# Patient Record
Sex: Female | Born: 1955
Health system: Southern US, Community
[De-identification: ages and names within clinical notes are randomized; demographics above are authoritative.]

## PROBLEM LIST (undated history)

## (undated) DIAGNOSIS — I10 Essential (primary) hypertension: Secondary | ICD-10-CM

## (undated) DIAGNOSIS — Z9289 Personal history of other medical treatment: Secondary | ICD-10-CM

## (undated) DIAGNOSIS — E7800 Pure hypercholesterolemia, unspecified: Secondary | ICD-10-CM

## (undated) DIAGNOSIS — K269 Duodenal ulcer, unspecified as acute or chronic, without hemorrhage or perforation: Secondary | ICD-10-CM

## (undated) HISTORY — DX: Personal history of other medical treatment: Z92.89

## (undated) HISTORY — DX: Essential (primary) hypertension: I10

## (undated) HISTORY — DX: Pure hypercholesterolemia, unspecified: E78.00

## (undated) HISTORY — DX: Duodenal ulcer, unspecified as acute or chronic, without hemorrhage or perforation: K26.9

---

## 2004-04-20 ENCOUNTER — Encounter: Admission: RE | Admit: 2004-04-20 | Discharge: 2004-04-20 | Payer: Self-pay | Admitting: Family Medicine

## 2010-10-14 ENCOUNTER — Other Ambulatory Visit (HOSPITAL_COMMUNITY)
Admission: RE | Admit: 2010-10-14 | Discharge: 2010-10-14 | Disposition: A | Discharge status: Home / Self Care | Payer: Self-pay | Source: Ambulatory Visit | Attending: Advanced Practice Midwife | Admitting: Advanced Practice Midwife

## 2010-10-14 DIAGNOSIS — Z124 Encounter for screening for malignant neoplasm of cervix: Secondary | ICD-10-CM | POA: Insufficient documentation

## 2016-04-01 ENCOUNTER — Other Ambulatory Visit: Payer: Self-pay | Admitting: Physician Assistant

## 2016-04-01 ENCOUNTER — Ambulatory Visit
Admission: RE | Admit: 2016-04-01 | Discharge: 2016-04-01 | Disposition: A | Discharge status: Home / Self Care | Payer: BLUE CROSS/BLUE SHIELD | Source: Ambulatory Visit | Attending: Physician Assistant | Admitting: Physician Assistant

## 2016-04-01 DIAGNOSIS — R52 Pain, unspecified: Secondary | ICD-10-CM

## 2020-03-13 DIAGNOSIS — Z0100 Encounter for examination of eyes and vision without abnormal findings: Secondary | ICD-10-CM | POA: Diagnosis not present

## 2020-03-13 DIAGNOSIS — H524 Presbyopia: Secondary | ICD-10-CM | POA: Diagnosis not present

## 2020-04-09 DIAGNOSIS — M2550 Pain in unspecified joint: Secondary | ICD-10-CM | POA: Diagnosis not present

## 2020-04-09 DIAGNOSIS — M25472 Effusion, left ankle: Secondary | ICD-10-CM | POA: Diagnosis not present

## 2020-10-15 DIAGNOSIS — E7800 Pure hypercholesterolemia, unspecified: Secondary | ICD-10-CM | POA: Diagnosis not present

## 2020-10-15 DIAGNOSIS — I10 Essential (primary) hypertension: Secondary | ICD-10-CM | POA: Diagnosis not present

## 2020-10-23 DIAGNOSIS — Z1231 Encounter for screening mammogram for malignant neoplasm of breast: Secondary | ICD-10-CM | POA: Diagnosis not present

## 2021-02-02 DIAGNOSIS — H1013 Acute atopic conjunctivitis, bilateral: Secondary | ICD-10-CM | POA: Diagnosis not present

## 2021-04-15 ENCOUNTER — Encounter (HOSPITAL_COMMUNITY): Payer: Self-pay | Admitting: Emergency Medicine

## 2021-04-15 ENCOUNTER — Other Ambulatory Visit: Payer: Self-pay

## 2021-04-15 ENCOUNTER — Emergency Department (HOSPITAL_COMMUNITY)
Admission: EM | Admit: 2021-04-15 | Discharge: 2021-04-16 | Disposition: A | Discharge status: Home / Self Care | Payer: Medicare HMO | Attending: Emergency Medicine | Admitting: Emergency Medicine

## 2021-04-15 ENCOUNTER — Emergency Department (HOSPITAL_COMMUNITY): Payer: Medicare HMO

## 2021-04-15 DIAGNOSIS — J9811 Atelectasis: Secondary | ICD-10-CM | POA: Diagnosis not present

## 2021-04-15 DIAGNOSIS — R002 Palpitations: Secondary | ICD-10-CM | POA: Diagnosis not present

## 2021-04-15 DIAGNOSIS — S86911A Strain of unspecified muscle(s) and tendon(s) at lower leg level, right leg, initial encounter: Secondary | ICD-10-CM | POA: Diagnosis not present

## 2021-04-15 DIAGNOSIS — Z8616 Personal history of COVID-19: Secondary | ICD-10-CM | POA: Diagnosis not present

## 2021-04-15 DIAGNOSIS — R0781 Pleurodynia: Secondary | ICD-10-CM | POA: Diagnosis not present

## 2021-04-15 DIAGNOSIS — R0602 Shortness of breath: Secondary | ICD-10-CM | POA: Diagnosis present

## 2021-04-15 DIAGNOSIS — I493 Ventricular premature depolarization: Secondary | ICD-10-CM

## 2021-04-15 LAB — BASIC METABOLIC PANEL
Anion gap: 7 (ref 5–15)
BUN: 14 mg/dL (ref 8–23)
CO2: 24 mmol/L (ref 22–32)
Calcium: 8.9 mg/dL (ref 8.9–10.3)
Chloride: 106 mmol/L (ref 98–111)
Creatinine, Ser: 0.81 mg/dL (ref 0.44–1.00)
GFR, Estimated: >60 mL/min (ref >60)
Glucose, Bld: 105 mg/dL — ABNORMAL HIGH (ref 70–99)
Potassium: 4.2 mmol/L (ref 3.5–5.1)
Sodium: 137 mmol/L (ref 135–145)

## 2021-04-15 LAB — CBC WITH DIFFERENTIAL/PLATELET
Abs Immature Granulocytes: 0.02 10*3/uL (ref 0.00–0.07)
Basophils Absolute: 0.1 10*3/uL (ref 0.0–0.1)
Basophils Relative: 1 %
Eosinophils Absolute: 0.2 10*3/uL (ref 0.0–0.5)
Eosinophils Relative: 2 %
HCT: 39.7 % (ref 36.0–46.0)
Hemoglobin: 12.4 g/dL (ref 12.0–15.0)
Immature Granulocytes: 0 %
Lymphocytes Relative: 25 %
Lymphs Abs: 2.2 10*3/uL (ref 0.7–4.0)
MCH: 26.8 pg (ref 26.0–34.0)
MCHC: 31.2 g/dL (ref 30.0–36.0)
MCV: 85.9 fL (ref 80.0–100.0)
Monocytes Absolute: 0.7 10*3/uL (ref 0.1–1.0)
Monocytes Relative: 9 %
Neutro Abs: 5.5 10*3/uL (ref 1.7–7.7)
Neutrophils Relative %: 63 %
Platelets: 287 10*3/uL (ref 150–400)
RBC: 4.62 MIL/uL (ref 3.87–5.11)
RDW: 14.8 % (ref 11.5–15.5)
WBC: 8.7 10*3/uL (ref 4.0–10.5)
nRBC: 0 % (ref 0.0–0.2)

## 2021-04-15 LAB — BRAIN NATRIURETIC PEPTIDE: B Natriuretic Peptide: 14.7 pg/mL (ref 0.0–100.0)

## 2021-04-15 LAB — TROPONIN I (HIGH SENSITIVITY): Troponin I (High Sensitivity): 6 ng/L (ref <18)

## 2021-04-15 NOTE — ED Triage Notes (Signed)
Patient here with shortness of breath and palpitations for the last 4 days.  She went to her PCP and a ddimer was done and was elevated at 3.  Patient states that she has had a blood clot in 1993 behind her knee.  Patient states that her doctor sent her to come to get a CT.

## 2021-04-15 NOTE — ED Provider Notes (Signed)
Emergency Medicine Provider Triage Evaluation Note  Crystal Becker , a 65 y.o. female  was evaluated in triage.  Pt complains of shortness of breath and palpitations.  Symptoms have been intermittent over the last 4 days.  Patient reports that she went to her primary care provider with [facility] physicians and had a D-dimer taken.  Patient states that D-dimer was elevated over 3 and she was told to come to the emergency department for CTA of chest to evaluate for PE.  Patient reports history of blood clot to lower extremity in 1993.  Review of Systems  Positive: Palpitations, shortness of breath Negative: Chest pain, leg swelling  Physical Exam  BP (!) 147/119 (BP Location: Left Arm)   Pulse 99   Temp 98 F (36.7 C) (Oral)   Resp 18   SpO2 99%  Gen:   Awake, no distress   Resp:  Normal effort, lungs clear to auscultation bilaterally MSK:   Moves extremities without difficulty, no swelling or tenderness to bilateral lower extremities Other:    Medical Decision Making  Medically screening exam initiated at 8:58 PM.  Appropriate orders placed.  Crystal Becker was informed that the remainder of the evaluation will be completed by another provider, this initial triage assessment does not replace that evaluation, and the importance of remaining in the ED until their evaluation is complete.  The patient appears stable so that the remainder of the work up may be completed by another provider.      Haskel Schroeder, PA-C 04/15/21 2101    Franne Forts, DO 04/16/21 0023

## 2021-04-16 ENCOUNTER — Emergency Department (HOSPITAL_COMMUNITY): Payer: Medicare HMO

## 2021-04-16 DIAGNOSIS — J9811 Atelectasis: Secondary | ICD-10-CM | POA: Diagnosis not present

## 2021-04-16 LAB — MAGNESIUM: Magnesium: 2.4 mg/dL (ref 1.7–2.4)

## 2021-04-16 LAB — TROPONIN I (HIGH SENSITIVITY): Troponin I (High Sensitivity): 6 ng/L (ref <18)

## 2021-04-16 MED ORDER — IOHEXOL 350 MG/ML SOLN
75.0000 mL | Freq: Once | INTRAVENOUS | Status: AC | PRN
  Administered 2021-04-16: 75 mL via INTRAVENOUS

## 2021-04-16 NOTE — Discharge Instructions (Addendum)
Try magnesium supplements over-the-counter.  He can take them a couple times a day.  Follow-up with your family doctor in the office.  They may refer you to a cardiologist or have a ultrasound of your heart performed.  Please return for worsening symptoms or symptoms that worsen with exertion.

## 2021-04-16 NOTE — ED Provider Notes (Signed)
[facility] EMERGENCY DEPARTMENT Provider Note   CSN: [id number] Arrival date & time: 04/15/21  2037     History Chief Complaint  Patient presents with   Shortness of Breath    Crystal Becker is a 65 y.o. female.  65 yo F with to complaints of shortness of breath.  Going on for about 4 days now.  Went to her primary care physician's office and had a D-dimer drawn that was elevated sent here for CT scan.  She does have a remote history of a DVT.  She is just getting over COVID.  Shortness of breath seems to come and go.  Not necessarily exertional.  She feels it happening from time to time and feels like her heart rate is irregular.  The history is provided by the patient.  Shortness of Breath Severity:  Moderate Onset quality:  Gradual Duration:  4 days Timing:  Constant Progression:  Worsening Chronicity:  New Relieved by:  Nothing Worsened by:  Nothing Ineffective treatments:  None tried Associated symptoms: no chest pain, no fever, no headaches, no vomiting and no wheezing       History reviewed. No pertinent past medical history.  There are no problems to display for this patient.   History reviewed. No pertinent surgical history.   OB History   No obstetric history on file.     No family history on file.     Home Medications Prior to Admission medications   Medication Sig Start Date End Date Taking? Authorizing Provider  acetaminophen (TYLENOL) 325 MG tablet Take 650 mg by mouth every 6 (six) hours as needed for moderate pain.   Yes [provider]  amLODipine (NORVASC) 10 MG tablet Take 10 mg by mouth daily. 03/29/21  Yes [provider]  CALCIUM-MAGNESIUM-ZINC PO Take 1 tablet by mouth daily.   Yes [provider]  Capsicum, Cayenne, (CAYENNE PO) Take 1 capsule by mouth daily.   Yes [provider]  CINNAMON PO Take 1 tablet by mouth daily.   Yes [provider]  Garlic (GARLIQUE PO) Take 1  tablet by mouth daily.   Yes [provider]  Glucosamine-Chondroitin (MOVE FREE PO) Take 1 capsule by mouth daily.   Yes [provider]  Multiple Vitamin (MULTIVITAMIN) tablet Take 1 tablet by mouth daily.   Yes [provider]    Allergies    Patient has no known allergies.  Review of Systems   Review of Systems  Constitutional:  Negative for chills and fever.  HENT:  Negative for congestion and rhinorrhea.   Eyes:  Negative for redness and visual disturbance.  Respiratory:  Positive for shortness of breath. Negative for wheezing.   Cardiovascular:  Positive for palpitations. Negative for chest pain.  Gastrointestinal:  Negative for nausea and vomiting.  Genitourinary:  Negative for dysuria and urgency.  Musculoskeletal:  Negative for arthralgias and myalgias.  Skin:  Negative for pallor and wound.  Neurological:  Negative for dizziness and headaches.   Physical Exam Updated Vital Signs BP (!) 145/93   Pulse 82   Temp 98.3 F (36.8 C) (Oral)   Resp 18   SpO2 98%   Physical Exam Vitals and nursing note reviewed.  Constitutional:      General: She is not in acute distress.    Appearance: She is well-developed. She is not diaphoretic.  HENT:     Head: Normocephalic and atraumatic.  Eyes:     Pupils: Pupils are equal, round,  and reactive to light.  Cardiovascular:     Rate and Rhythm: Normal rate and regular rhythm.     Heart sounds: No murmur heard.   No friction rub. No gallop.  Pulmonary:     Effort: Pulmonary effort is normal.     Breath sounds: No wheezing or rales.  Abdominal:     General: There is no distension.     Palpations: Abdomen is soft.     Tenderness: There is no abdominal tenderness.  Musculoskeletal:        General: No tenderness.     Cervical back: Normal range of motion and neck supple.  Skin:    General: Skin is warm and dry.  Neurological:     Mental Status: She is alert and oriented to person, place, and time.   Psychiatric:        Behavior: Behavior normal.    ED Results / Procedures / Treatments   Labs (all labs ordered are listed, but only abnormal results are displayed) Labs Reviewed  BASIC METABOLIC PANEL - Abnormal; Notable for the following components:      Result Value   Glucose, Bld 105 (*)    All other components within normal limits  CBC WITH DIFFERENTIAL/PLATELET  BRAIN NATRIURETIC PEPTIDE  MAGNESIUM  TROPONIN I (HIGH SENSITIVITY)  TROPONIN I (HIGH SENSITIVITY)    EKG EKG Interpretation  Date/Time:  Thursday April 15 2021 20:47:52 EDT Ventricular Rate:  93 PR Interval:  156 QRS Duration: 98 QT Interval:  350 QTC Calculation: 435 R Axis:   -61 Text Interpretation: Sinus rhythm with occasional Premature ventricular complexes Possible Left atrial enlargement Left anterior fascicular block Abnormal ECG No old tracing to compare Confirmed by Melene Plan [phone]) on 04/15/2021 11:03:12 PM  Radiology CT Angio Chest PE W and/or Wo Contrast  Result Date: 04/16/2021 CLINICAL DATA:  Concern for pulmonary embolism. EXAM: CT ANGIOGRAPHY CHEST WITH CONTRAST TECHNIQUE: Multidetector CT imaging of the chest was performed using the standard protocol during bolus administration of intravenous contrast. Multiplanar CT image reconstructions and MIPs were obtained to evaluate the vascular anatomy. CONTRAST:  55mL OMNIPAQUE IOHEXOL 350 MG/ML SOLN COMPARISON:  None. FINDINGS: Cardiovascular: There is no cardiomegaly or pericardial effusion. The thoracic aorta is unremarkable. Evaluation of the pulmonary arteries is limited due to streak artifact caused by patient's arms. No pulmonary artery embolus identified. Mediastinum/Nodes: No hilar or mediastinal adenopathy. The esophagus is grossly unremarkable. No mediastinal fluid collection. Lungs/Pleura: Minimal bibasilar atelectasis. No focal consolidation, pleural effusion, pneumothorax. The central airways are patent. Upper Abdomen: Multiple liver cysts.  Musculoskeletal: Osteopenia.  No acute osseous pathology. Review of the MIP images confirms the above findings. IMPRESSION: No acute intrathoracic pathology. No pulmonary artery embolus identified. Electronically Signed   By: Elgie Collard M.D.   On: 04/16/2021 00:35    Procedures Procedures   Medications Ordered in ED Medications  iohexol (OMNIPAQUE) 350 MG/ML injection 75 mL (75 mLs Intravenous Contrast Given 04/16/21 0020)    ED Course  I have reviewed the triage vital signs and the nursing notes.  Pertinent labs & imaging results that were available during my care of the patient were reviewed by me and considered in my medical decision making (see chart for details).    MDM Rules/Calculators/A&P                           65 yo F with a cc of palpitations.  PVC's on the monitor.  Could  be symptomatic PVCs based on history.  She was sent however because her D-dimer was elevated will obtain a CT angiogram of the chest.  Troponin negative blood work otherwise unremarkable BNP is normal.  CT angiogram of the chest is negative for pulmonary embolism.  No occult pneumonia.  Reassessed the patient.  Continues to have symptoms more consistent with PVCs.  We will treat with magnesium supplementation.  Mag level here is normal.  Delta Trop here is negative.  PCP follow-up.  1:22 AM:  I have discussed the diagnosis/risks/treatment options with the patient and believe the pt to be eligible for discharge home to follow-up with PCP. We also discussed returning to the ED immediately if new or worsening sx occur. We discussed the sx which are most concerning (e.g., sudden worsening pain, fever, inability to tolerate by mouth) that necessitate immediate return. Medications administered to the patient during their visit and any new prescriptions provided to the patient are listed below.  Medications given during this visit Medications  iohexol (OMNIPAQUE) 350 MG/ML injection 75 mL (75 mLs Intravenous  Contrast Given 04/16/21 0020)     The patient appears reasonably screen and/or stabilized for discharge and I doubt any other medical condition or other [facility] requiring further screening, evaluation, or treatment in the ED at this time prior to discharge.    Final Clinical Impression(s) / ED Diagnoses Final diagnoses:  PVC (premature ventricular contraction)    Rx / DC Orders ED Discharge Orders     None        Melene Plan, DO 04/16/21 0122

## 2021-04-29 DIAGNOSIS — M7989 Other specified soft tissue disorders: Secondary | ICD-10-CM | POA: Diagnosis not present

## 2021-04-29 DIAGNOSIS — R002 Palpitations: Secondary | ICD-10-CM | POA: Diagnosis not present

## 2021-04-29 DIAGNOSIS — R0781 Pleurodynia: Secondary | ICD-10-CM | POA: Diagnosis not present

## 2021-04-29 DIAGNOSIS — M25561 Pain in right knee: Secondary | ICD-10-CM | POA: Diagnosis not present

## 2021-05-12 DIAGNOSIS — M25561 Pain in right knee: Secondary | ICD-10-CM | POA: Diagnosis not present

## 2021-05-12 DIAGNOSIS — I10 Essential (primary) hypertension: Secondary | ICD-10-CM | POA: Diagnosis not present

## 2021-05-13 ENCOUNTER — Other Ambulatory Visit: Payer: Self-pay | Admitting: Sports Medicine

## 2021-05-13 ENCOUNTER — Ambulatory Visit
Admission: RE | Admit: 2021-05-13 | Discharge: 2021-05-13 | Disposition: A | Discharge status: Home / Self Care | Payer: Medicare HMO | Source: Ambulatory Visit | Attending: Sports Medicine | Admitting: Sports Medicine

## 2021-05-13 DIAGNOSIS — M25461 Effusion, right knee: Secondary | ICD-10-CM | POA: Diagnosis not present

## 2021-05-13 DIAGNOSIS — M25561 Pain in right knee: Secondary | ICD-10-CM

## 2021-05-20 ENCOUNTER — Other Ambulatory Visit: Payer: Self-pay

## 2021-05-20 ENCOUNTER — Ambulatory Visit: Payer: Medicare HMO | Attending: Sports Medicine | Admitting: Physical Therapy

## 2021-05-20 DIAGNOSIS — M6281 Muscle weakness (generalized): Secondary | ICD-10-CM | POA: Diagnosis not present

## 2021-05-20 DIAGNOSIS — R600 Localized edema: Secondary | ICD-10-CM | POA: Diagnosis not present

## 2021-05-20 DIAGNOSIS — M25561 Pain in right knee: Secondary | ICD-10-CM | POA: Diagnosis not present

## 2021-05-20 DIAGNOSIS — M25661 Stiffness of right knee, not elsewhere classified: Secondary | ICD-10-CM | POA: Insufficient documentation

## 2021-05-20 NOTE — Patient Instructions (Addendum)
Medbridge Access Code: [email or web address]  URL: [email or web address]  Date: 05/20/2021  Prepared by: Karie Mainland    Exercises  Supine Quad Set - 3 x daily - 7 x weekly - 2 sets - 10 reps - 5-10 hold  Supine Heel Slide with Strap - 3 x daily - 7 x weekly - 2 sets - 10 reps - 15 hold  Seated Long Arc Quad - 3 x daily - 7 x weekly - 2 sets - 10 reps - 5 hold

## 2021-05-20 NOTE — Therapy (Signed)
[facility] [address] [place], [place], [address] Phone: [phone]   Fax:  [phone]  Physical Therapy Evaluation  Patient Details  Name: Crystal Becker MRN: [id number] Date of Birth: 1956/07/04 Referring Provider (PT): Dr, Christena Deem   Encounter Date: 05/20/2021   PT End of Session - 05/20/21 1101     Visit Number 1    Number of Visits 16    Date for PT Re-Evaluation 07/15/21    Authorization Type [employer] MCR    PT Start Time 1015    PT Stop Time 1108    PT Time Calculation (min) 53 min    Activity Tolerance Patient tolerated treatment well;Patient limited by pain    Behavior During Therapy [facility] for tasks assessed/performed             No past medical history on file.  No past surgical history on file.  There were no vitals filed for this visit.    Subjective Assessment - 05/20/21 1018     Subjective Patient had some mild discomfort in knee about a week, then she was in the yard and she twisted away from her knee (L) and the knee "didnt move with me" 04/12/21.  Since then she had swelling, pain, stiffness, difficulty walking and limitations in mobility in her home and in the community. " I just cant walk around it is just too much". Cortisone did not help much but it is less swollen.    Pertinent History HTN    Limitations Sitting;Lifting;Standing;Walking;House hold activities    Diagnostic tests XR essentially normal, mod effusion    Patient Stated Goals Patient would like to be able to walk like she used to for exercise.    Currently in Pain? Yes    Pain Score 5     Pain Location Knee    Pain Orientation Right;Anterior;Medial    Pain Descriptors / Indicators Aching;Dull;Sore    Pain Type Acute pain    Pain Onset More than a month ago    Pain Frequency Intermittent    Aggravating Factors  bending it, walking, weightbearing    Pain Relieving Factors rest, ice    Effect of Pain on Daily Activities  cannot do her normal fitness, 2 miles x 4 days per week and 15,000 steps per day    Multiple Pain Sites No                [facility] PT Assessment - 05/20/21 0001       Assessment   Medical Diagnosis R knee pain    Referring Provider (PT) Dr, Christena Deem    Onset Date/Surgical Date 04/12/21    Prior Therapy No      Precautions   Precautions None      Restrictions   Weight Bearing Restrictions No      Balance Screen   Has the patient fallen in the past 6 months Yes      Home Environment   Living Environment Private residence    Living Arrangements Spouse/significant other    Type of Home House    Home Access Stairs to enter    Entrance Stairs-Number of Steps 3    Entrance Stairs-Rails Right;Left    Additional Comments did use cane for a short time      Prior Function   Level of Independence Independent    Vocation Part time employment;Self employed    [employer] 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[employer],  food bank, cooking      Cognition   Overall Cognitive Status Within Functional Limits for tasks assessed      Observation/Other Assessments   Focus on Therapeutic Outcomes (FOTO)  33%      Sensation   Light Touch Appears Intact    Additional Comments tingling in foot at times      Coordination   Gross Motor Movements are Fluid and Coordinated Not tested      Posture/Postural Control   Posture Comments decr WB on Rt knee      AROM   Right Knee Extension 10    Right Knee Flexion 100    Left Knee Extension 0    Left Knee Flexion 132      Strength   Right Knee Flexion 4/5    Right Knee Extension 3/5   pain   Left Knee Flexion 5/5    Left Knee Extension 5/5      Palpation   Patella mobility pain, hypomobile    Palpation comment Pain medial joint line, swelling present      Special Tests   Other special tests pain with meniscal testing      Transfers   Five time sit to stand comments  34 sec      Ambulation/Gait   Gait  Pattern Step-to pattern;Decreased stance time - right;Decreased hip/knee flexion - right;Right flexed knee in stance;Antalgic                        Objective measurements completed on examination: See above findings.       [facility] Adult PT Treatment/Exercise - 05/20/21 0001       Vasopneumatic   Number Minutes Vasopneumatic  15 minutes    Vasopnuematic Location  Knee    Vasopneumatic Pressure Low    Vasopneumatic Temperature  34                     PT Education - 05/20/21 1513     Education Details PT/POC, HEP, gentle ROM for Rt knee, ice post    Person(s) Educated Patient    Methods Explanation;Demonstration;Verbal cues;Handout    Comprehension Verbalized understanding;Returned demonstration              PT Short Term Goals - 05/20/21 1515       PT SHORT TERM GOAL #1   Title Pt will be I with HEP for Rt knee ROM and strength    Time 4    Period Weeks    Status New    Target Date 06/17/21      PT SHORT TERM GOAL #2   Title Pt will be able to 5 x sit to stand without UEs in 20 sec or less    Baseline 34 sec    Time 4    Period Weeks    Status New    Target Date 06/17/21      PT SHORT TERM GOAL #3   Title Pt will be able to perform straight leg raise with no more than 5 deg lag    Time 4    Period Weeks    Status New    Target Date 06/17/21      PT SHORT TERM GOAL #4   Title Pt will understand FOTO and potential to improve condition, function in Rt knee.    Time 4    Period Weeks    Status New    Target Date 06/17/21  PT Long Term Goals - 05/20/21 1521       PT LONG TERM GOAL #1   Title Pt will improve FOTO score to 60% or better to demo functional improvement    Time 8    Period Weeks    Status New    Target Date 07/15/21      PT LONG TERM GOAL #2   Title Pt will be able to bend knee to at least 120 deg with min R knee discomfort    Time 8    Period Weeks    Status New    Target Date 07/15/21       PT LONG TERM GOAL #3   Title Pt will demo Rt knee strength to 5/5 for improved gait, stairs and stability with yardwork, home tasks    Time 8    Period Weeks    Status New    Target Date 07/15/21      PT LONG TERM GOAL #4   Title Pt will be able to walk in the community as needed without limitation of pain an hour or more.    Time 8    Period Weeks    Status New                    Plan - 05/20/21 1100     Clinical Impression Statement Patient presents for low complexiy eval of R knee pain due following planting/twisting mechanism of injury.  She has pain medially, swelling and signifcant limitations in Rt knee AROM.  She is unable to walk normally, limps and tkaes increased time to transition from sit to stand .  She is noting a gradual improvement since the onset of her injury but continues to be very limited in her normal daily activities.  She will benefit from skilled PT to improve knee function to allow retunn to recreation and commnuity mobility.    Personal Factors and Comorbidities Comorbidity 1    Comorbidities HTN    Examination-Activity Limitations Squat;Stairs;Lift;Bed Mobility;Bend;Locomotion Level;Stand;Transfers;Sit;Dressing;Sleep    Examination-Participation Restrictions Cleaning;Shop;Community Activity;Yard Work;Interpersonal Relationship;Occupation;Volunteer;Church    Stability/Clinical Decision Making Stable/Uncomplicated    Clinical Decision Making Low    Rehab Potential Excellent    PT Frequency 2x / week    PT Duration 8 weeks    PT Treatment/Interventions ADLs/Self Care Home Management;Electrical Stimulation;Therapeutic activities;Patient/family education;Taping;Therapeutic exercise;Moist Heat;Ultrasound;Cryotherapy;Gait training;Functional mobility training;Manual techniques;Passive range of motion;Balance training;Vasopneumatic Device;Iontophoresis 4mg /ml Dexamethasone    PT Next Visit Plan Rt knee AAROM, strength.  vaso, nustep    PT Home  Exercise Plan Access Code:  URL: [email or web address]  Date: 05/20/2021  Prepared by: 05/22/2021    Exercises  Supine Quad Set - 3 x daily - 7 x weekly - 2 sets - 10 reps - 5-10 hold  Supine Heel Slide with Strap - 3 x daily - 7 x weekly - 2 sets - 10 reps - 15 hold  Seated Long Arc Quad - 3 x daily - 7 x weekly - 2 sets - 10 reps - 5 hold    Consulted and Agree with Plan of Care Patient             Patient will benefit from skilled therapeutic intervention in order to improve the following deficits and impairments:  Increased fascial restricitons, Impaired flexibility, Increased edema, Difficulty walking, Decreased balance, Decreased range of motion, Decreased strength, Hypomobility, Decreased mobility, Pain, Abnormal gait  Visit Diagnosis: Muscle weakness (generalized)  Acute pain of right knee  Localized edema  Stiffness of right knee, not elsewhere classified     Problem List There are no problems to display for this patient.   Nancy Arvin, PT 05/20/2021, 3:37 PM  [facility] [address] [place], [place], [address] Phone: [phone]   Fax:  [phone]  Name: Crystal Becker MRN: [id number] Date of Birth: September 06, 1955  Referring diagnosis? Rt knee pain    Treatment diagnosis? (if different than referring diagnosis) Rt knee pain, weakness, stiffness of R knee, edema   What was this (referring dx) caused by? []  Surgery []  Fall []  Ongoing issue []  Arthritis [x]  Other: __twisting injury __________  Laterality: [x]  Rt []  Lt []  Both  Check all possible CPT codes:      [x]  97110 (Therapeutic Exercise)  []  92507 (SLP Treatment)  [x]  97112 (Neuro Re-ed)   []  92526 (Swallowing Treatment)   [x]  97116 (Gait Training)   []  (Cognitive Training, 1st 15 minutes) [x]  97140 (Manual Therapy)   []  97130 (Cognitive Training, each add'l 15 minutes)  [x]  97530 (Therapeutic Activities)  []  Other,  List CPT Code ____________    [x]  97535 (Self Care)       []  All codes above (97110 - 97535)  []  97012 (Mechanical Traction)  [x]  97014 (E-stim Unattended)  []  97032 (E-stim manual)  [x]  97033 (Ionto)  [x]  (Ultrasound)  []  97760 (Orthotic Fit) []  (Physical Performance Training) []  (Aquatic Therapy) []  97034 (Contrast Bath) []  (Paraffin) []  97597 (Wound Care 1st 20 sq cm) []  97598 (Wound Care each add'l 20 sq cm) [x]  97016 (Vasopneumatic Device) []   ) []  (Prosthetic Training)   , PT 05/20/21 3:37 PM Phone: [phone] Fax: [phone]

## 2021-05-24 ENCOUNTER — Other Ambulatory Visit: Payer: Self-pay

## 2021-05-24 ENCOUNTER — Ambulatory Visit: Payer: Medicare HMO

## 2021-05-24 DIAGNOSIS — M25661 Stiffness of right knee, not elsewhere classified: Secondary | ICD-10-CM

## 2021-05-24 DIAGNOSIS — R600 Localized edema: Secondary | ICD-10-CM

## 2021-05-24 DIAGNOSIS — M25561 Pain in right knee: Secondary | ICD-10-CM | POA: Diagnosis not present

## 2021-05-24 DIAGNOSIS — M6281 Muscle weakness (generalized): Secondary | ICD-10-CM

## 2021-05-24 NOTE — Therapy (Signed)
[facility] Outpatient Rehabilitation [facility] [address] [place], [place], [address] Phone: [phone]   Fax:  [phone]  Physical Therapy Treatment  Patient Details  Name: Crystal Becker MRN: [id number] Date of Birth: [birth year] Referring Provider (PT): Dr, Christena Deem   Encounter Date: 05/24/2021   PT End of Session - 05/24/21 0745     Visit Number 2    Number of Visits 16    Date for PT Re-Evaluation 07/15/21    Authorization Type [employer] MCR    PT Start Time 0746    PT Stop Time 0825    PT Time Calculation (min) 39 min    Activity Tolerance Patient tolerated treatment well;Patient limited by pain    Behavior During Therapy [facility] for tasks assessed/performed             No past medical history on file.  No past surgical history on file.  There were no vitals filed for this visit.   Subjective Assessment - 05/24/21 0746     Subjective Pt presents to PT with reports of continued R knee pain. Notes that she has a dull ache behind knee cap after performing HEP over the weekend. Pt is ready to being PT treatment at this time.    Currently in Pain? Yes    Pain Score 4     Pain Location Knee    Pain Orientation Right    Pain Descriptors / Indicators Aching;Dull           [facility] Adult PT Treatment/Exercise:   Therapeutic Exercise:  NuStep lvl 5 UE/LE while taking subjective LAQ 2x10 R LE - 5 sec hold - 2nd set w/ 2lb Supine quad set x 10 R LE - 5 sec hold Supine SLR 2x10 S/L clamshell 2x10 red tband     [facility] PT Assessment - 05/24/21 0001       AROM   Right Knee Extension 6    Right Knee Flexion 100                                    PT Education - 05/24/21 0821     Education Details HEP update    Person(s) Educated Patient    Methods Explanation;Demonstration;Handout    Comprehension Verbalized understanding;Returned demonstration              PT Short Term Goals - 05/20/21 1515       PT  SHORT TERM GOAL #1   Title Pt will be I with HEP for Rt knee ROM and strength    Time 4    Period Weeks    Status New    Target Date 06/17/21      PT SHORT TERM GOAL #2   Title Pt will be able to 5 x sit to stand without UEs in 20 sec or less    Baseline 34 sec    Time 4    Period Weeks    Status New    Target Date 06/17/21      PT SHORT TERM GOAL #3   Title Pt will be able to perform straight leg raise with no more than 5 deg lag    Time 4    Period Weeks    Status New    Target Date 06/17/21      PT SHORT TERM GOAL #4   Title Pt will understand FOTO and potential to improve condition,  function in Rt knee.    Time 4    Period Weeks    Status New    Target Date 06/17/21               PT Long Term Goals - 05/20/21 1521       PT LONG TERM GOAL #1   Title Pt will improve FOTO score to 60% or better to demo functional improvement    Time 8    Period Weeks    Status New    Target Date 07/15/21      PT LONG TERM GOAL #2   Title Pt will be able to bend knee to at least 120 deg with min R knee discomfort    Time 8    Period Weeks    Status New    Target Date 07/15/21      PT LONG TERM GOAL #3   Title Pt will demo Rt knee strength to 5/5 for improved gait, stairs and stability with yardwork, home tasks    Time 8    Period Weeks    Status New    Target Date 07/15/21      PT LONG TERM GOAL #4   Title Pt will be able to walk in the community as needed without limitation of pain an hour or more.    Time 8    Period Weeks    Status New                   Plan - 05/24/21 0759     Clinical Impression Statement Pt was able to complete prescribed exercises with no adverse effect or change in baseline pain. Today's session focused on increasing LE strength and general ROM of R knee. Pt demonstrated improved R knee ext today, but does continue to demo decreased strength and flexion. Pt continues to benefit from skilled PT services and should continue to be  seen and progressed as tolerated.    PT Treatment/Interventions ADLs/Self Care Home Management;Electrical Stimulation;Therapeutic activities;Patient/family education;Taping;Therapeutic exercise;Moist Heat;Ultrasound;Cryotherapy;Gait training;Functional mobility training;Manual techniques;Passive range of motion;Balance training;Vasopneumatic Device;Iontophoresis 4mg /ml Dexamethasone    PT Next Visit Plan progress LE strengthening and R knee ROM    PT Home Exercise Plan Access Code:  URL: [email or web address]  Date: 05/20/2021  Prepared by: 05/22/2021    Exercises  Supine Quad Set - 3 x daily - 7 x weekly - 2 sets - 10 reps - 5-10 hold  Supine Heel Slide with Strap - 3 x daily - 7 x weekly - 2 sets - 10 reps - 15 hold  Seated Long Arc Quad - 3 x daily - 7 x weekly - 2 sets - 10 reps - 5 hold             Patient will benefit from skilled therapeutic intervention in order to improve the following deficits and impairments:  Increased fascial restricitons, Impaired flexibility, Increased edema, Difficulty walking, Decreased balance, Decreased range of motion, Decreased strength, Hypomobility, Decreased mobility, Pain, Abnormal gait  Visit Diagnosis: Acute pain of right knee  Muscle weakness (generalized)  Localized edema  Stiffness of right knee, not elsewhere classified     Problem List There are no problems to display for this patient.   Karie Mainland, PT 05/24/2021, 8:25 AM  [facility] [address] [place], [place], [place] Phone: [phone]   Fax:  [phone]  Name: Crystal Becker MRN: Prentiss Bells Date of Birth: Dec 02, 1955

## 2021-05-27 ENCOUNTER — Other Ambulatory Visit: Payer: Self-pay

## 2021-05-27 ENCOUNTER — Ambulatory Visit: Payer: Medicare HMO

## 2021-05-27 DIAGNOSIS — M25661 Stiffness of right knee, not elsewhere classified: Secondary | ICD-10-CM | POA: Diagnosis not present

## 2021-05-27 DIAGNOSIS — M25561 Pain in right knee: Secondary | ICD-10-CM | POA: Diagnosis not present

## 2021-05-27 DIAGNOSIS — M6281 Muscle weakness (generalized): Secondary | ICD-10-CM | POA: Diagnosis not present

## 2021-05-27 DIAGNOSIS — R600 Localized edema: Secondary | ICD-10-CM | POA: Diagnosis not present

## 2021-05-27 NOTE — Therapy (Signed)
[facility] Outpatient Rehabilitation [facility] [address] [place], [place], [address] Phone: [phone]   Fax:  [phone]  Physical Therapy Treatment  Patient Details  Name: Crystal Becker MRN: [id number] Date of Birth: 13-Aug-1956 Referring Provider (PT): Dr, Christena Deem   Encounter Date: 05/27/2021   PT End of Session - 05/27/21 1005     Visit Number 3    Number of Visits 16    Date for PT Re-Evaluation 07/15/21    Authorization Type [employer] MCR    PT Start Time 1005    PT Stop Time 1043    PT Time Calculation (min) 38 min    Activity Tolerance Patient tolerated treatment well;Patient limited by pain    Behavior During Therapy [facility] for tasks assessed/performed             No past medical history on file.  No past surgical history on file.  There were no vitals filed for this visit.   Subjective Assessment - 05/27/21 1005     Subjective Pt presents to PT with reports of slight continued R knee pain. Has been compliant with her HEP with no adverse effect. Pt is ready to begin PT at this time.    Currently in Pain? Yes    Pain Score 3     Pain Location Knee    Pain Orientation Right           [facility] Adult PT Treatment/Exercise:   Therapeutic Exercise:  NuStep lvl 5 UE/LE while taking subjective SAQ 3x10 2lbs LAQ 2x10 - 5 sec hold - 2lb Supine quad set x 10 R LE - 5 sec hold Supine SLR 2x10 S/L clamshell 2x15 green tband Seated hamstring stretch 2x30 sec ea Seated heel slide R 2x10 - 5 sec hold Standing hip abd/ext 2x10 - 25lbs     [facility] PT Assessment - 05/27/21 0001       AROM   Right Knee Flexion 110                                      PT Short Term Goals - 05/20/21 1515       PT SHORT TERM GOAL #1   Title Pt will be I with HEP for Rt knee ROM and strength    Time 4    Period Weeks    Status New    Target Date 06/17/21      PT SHORT TERM GOAL #2   Title Pt will be able to 5 x sit to  stand without UEs in 20 sec or less    Baseline 34 sec    Time 4    Period Weeks    Status New    Target Date 06/17/21      PT SHORT TERM GOAL #3   Title Pt will be able to perform straight leg raise with no more than 5 deg lag    Time 4    Period Weeks    Status New    Target Date 06/17/21      PT SHORT TERM GOAL #4   Title Pt will understand FOTO and potential to improve condition, function in Rt knee.    Time 4    Period Weeks    Status New    Target Date 06/17/21               PT Long Term Goals -  05/20/21 1521       PT LONG TERM GOAL #1   Title Pt will improve FOTO score to 60% or better to demo functional improvement    Time 8    Period Weeks    Status New    Target Date 07/15/21      PT LONG TERM GOAL #2   Title Pt will be able to bend knee to at least 120 deg with min R knee discomfort    Time 8    Period Weeks    Status New    Target Date 07/15/21      PT LONG TERM GOAL #3   Title Pt will demo Rt knee strength to 5/5 for improved gait, stairs and stability with yardwork, home tasks    Time 8    Period Weeks    Status New    Target Date 07/15/21      PT LONG TERM GOAL #4   Title Pt will be able to walk in the community as needed without limitation of pain an hour or more.    Time 8    Period Weeks    Status New                   Plan - 05/27/21 1014     Clinical Impression Statement Pt was again able to complete prescribed exercises with no adverse effects or change in baseline. Today's PT session continued to focus on improving proximal hip and quad strength, for improving comfort and function of R knee. She continues to benefit from skilled PT services and should continue to be seen and progressed as tolerated.    PT Treatment/Interventions ADLs/Self Care Home Management;Electrical Stimulation;Therapeutic activities;Patient/family education;Taping;Therapeutic exercise;Moist Heat;Ultrasound;Cryotherapy;Gait training;Functional mobility  training;Manual techniques;Passive range of motion;Balance training;Vasopneumatic Device;Iontophoresis 4mg /ml Dexamethasone    PT Next Visit Plan progress LE strengthening and R knee ROM    PT Home Exercise Plan Access Code:  URL: [email or web address]  Date: 05/20/2021  Prepared by: 05/22/2021    Exercises  Supine Quad Set - 3 x daily - 7 x weekly - 2 sets - 10 reps - 5-10 hold  Supine Heel Slide with Strap - 3 x daily - 7 x weekly - 2 sets - 10 reps - 15 hold  Seated Long Arc Quad - 3 x daily - 7 x weekly - 2 sets - 10 reps - 5 hold             Patient will benefit from skilled therapeutic intervention in order to improve the following deficits and impairments:  Increased fascial restricitons, Impaired flexibility, Increased edema, Difficulty walking, Decreased balance, Decreased range of motion, Decreased strength, Hypomobility, Decreased mobility, Pain, Abnormal gait  Visit Diagnosis: Acute pain of right knee  Muscle weakness (generalized)  Localized edema  Stiffness of right knee, not elsewhere classified     Problem List There are no problems to display for this patient.   Karie Mainland, PT 05/27/2021, 10:43 AM  [facility] [address] [place], [place], [place] Phone: [phone]   Fax:  [phone]  Name: Crystal Becker MRN: Prentiss Bells Date of Birth: Nov 13, 1955

## 2021-05-31 ENCOUNTER — Ambulatory Visit: Payer: Medicare HMO | Attending: Sports Medicine | Admitting: Physical Therapy

## 2021-05-31 ENCOUNTER — Other Ambulatory Visit: Payer: Self-pay

## 2021-05-31 DIAGNOSIS — M25561 Pain in right knee: Secondary | ICD-10-CM | POA: Insufficient documentation

## 2021-05-31 DIAGNOSIS — R600 Localized edema: Secondary | ICD-10-CM | POA: Insufficient documentation

## 2021-05-31 DIAGNOSIS — M25661 Stiffness of right knee, not elsewhere classified: Secondary | ICD-10-CM | POA: Diagnosis not present

## 2021-05-31 DIAGNOSIS — M6281 Muscle weakness (generalized): Secondary | ICD-10-CM | POA: Insufficient documentation

## 2021-05-31 NOTE — Therapy (Signed)
[facility] Outpatient Rehabilitation [facility] [address] [place], [place], [address] Phone: [phone]   Fax:  [phone]  Physical Therapy Treatment  Patient Details  Name: Crystal Becker MRN: [id number] Date of Birth: February 25, 1956 Referring Provider (PT): Dr, Christena Deem   Encounter Date: 05/31/2021   PT End of Session - 05/31/21 1508     Visit Number 4    Number of Visits 16    Date for PT Re-Evaluation 07/15/21    Authorization Type [employer] MCR    PT Start Time 1505    PT Stop Time 1545    PT Time Calculation (min) 40 min    Activity Tolerance Patient limited by pain;Patient tolerated treatment well    Behavior During Therapy [facility] for tasks assessed/performed             No past medical history on file.  No past surgical history on file.  There were no vitals filed for this visit.   Subjective Assessment - 05/31/21 1504     Subjective I was doing too much this weekend, was very busy.  Today its ok>  Did exerises this morning.                                 [facility] Adult PT Treatment/Exercise:   Therapeutic Exercise:  NuStep lvl 7 UE/LE  Supported squats x 15 feet wide  LAQ 2x10 - 5 sec hold - 4 lb Supine quad set x 10 R LE - 5 sec hold Supine SLR 2 x10 neutral then toes out  Supine hamstring stretch 2x30 sec ea LAQ with green band x 15 supine and seated (pain in supine)  Standing hip abd/ext 2x10 - red band at ankles stand on foam pad  Sit to stand green band x 15 to correct valgus          PT Short Term Goals - 05/20/21 1515       PT SHORT TERM GOAL #1   Title Pt will be I with HEP for Rt knee ROM and strength    Time 4    Period Weeks    Status New    Target Date 06/17/21      PT SHORT TERM GOAL #2   Title Pt will be able to 5 x sit to stand without UEs in 20 sec or less    Baseline 34 sec    Time 4    Period Weeks    Status New    Target Date 06/17/21      PT SHORT TERM GOAL #3   Title Pt  will be able to perform straight leg raise with no more than 5 deg lag    Time 4    Period Weeks    Status New    Target Date 06/17/21      PT SHORT TERM GOAL #4   Title Pt will understand FOTO and potential to improve condition, function in Rt knee.    Time 4    Period Weeks    Status New    Target Date 06/17/21               PT Long Term Goals - 05/20/21 1521       PT LONG TERM GOAL #1   Title Pt will improve FOTO score to 60% or better to demo functional improvement    Time 8    Period Weeks  Status New    Target Date 07/15/21      PT LONG TERM GOAL #2   Title Pt will be able to bend knee to at least 120 deg with min R knee discomfort    Time 8    Period Weeks    Status New    Target Date 07/15/21      PT LONG TERM GOAL #3   Title Pt will demo Rt knee strength to 5/5 for improved gait, stairs and stability with yardwork, home tasks    Time 8    Period Weeks    Status New    Target Date 07/15/21      PT LONG TERM GOAL #4   Title Pt will be able to walk in the community as needed without limitation of pain an hour or more.    Time 8    Period Weeks    Status New                   Plan - 05/31/21 1511     Clinical Impression Statement Pt does note improved pain and function overall.  Tolerates increased weight and closed chain exercise well.  Modified HEP to 2 x per day, is compliant and continues to be active in her home, walking 15176 steps per day some days.    PT Treatment/Interventions ADLs/Self Care Home Management;Electrical Stimulation;Therapeutic activities;Patient/family education;Taping;Therapeutic exercise;Moist Heat;Ultrasound;Cryotherapy;Gait training;Functional mobility training;Manual techniques;Passive range of motion;Balance training;Vasopneumatic Device;Iontophoresis 4mg /ml Dexamethasone    PT Next Visit Plan progress LE strengthening and R knee ROM    PT Home Exercise Plan Access Code: [email or web address]    Consulted and Agree with Plan  of Care Patient             Patient will benefit from skilled therapeutic intervention in order to improve the following deficits and impairments:  Increased fascial restricitons, Impaired flexibility, Increased edema, Difficulty walking, Decreased balance, Decreased range of motion, Decreased strength, Hypomobility, Decreased mobility, Pain, Abnormal gait  Visit Diagnosis: Acute pain of right knee  Muscle weakness (generalized)  Localized edema  Stiffness of right knee, not elsewhere classified     Problem List There are no problems to display for this patient.   Otis Portal, PT 05/31/2021, 3:41 PM  [facility] [address] [place], [place], [place] Phone: [phone]   Fax:  [phone]  Name: Crystal Becker MRN: Prentiss Bells Date of Birth: 10-Feb-1956  01/23/1956, PT 05/31/21 3:42 PM Phone: [phone] Fax: [phone]

## 2021-06-02 ENCOUNTER — Ambulatory Visit: Payer: Medicare HMO

## 2021-06-02 ENCOUNTER — Other Ambulatory Visit: Payer: Self-pay

## 2021-06-02 DIAGNOSIS — M25561 Pain in right knee: Secondary | ICD-10-CM | POA: Diagnosis not present

## 2021-06-02 DIAGNOSIS — R600 Localized edema: Secondary | ICD-10-CM | POA: Diagnosis not present

## 2021-06-02 DIAGNOSIS — Z0000 Encounter for general adult medical examination without abnormal findings: Secondary | ICD-10-CM | POA: Diagnosis not present

## 2021-06-02 DIAGNOSIS — Z1159 Encounter for screening for other viral diseases: Secondary | ICD-10-CM | POA: Diagnosis not present

## 2021-06-02 DIAGNOSIS — M25661 Stiffness of right knee, not elsewhere classified: Secondary | ICD-10-CM

## 2021-06-02 DIAGNOSIS — M6281 Muscle weakness (generalized): Secondary | ICD-10-CM

## 2021-06-02 DIAGNOSIS — I10 Essential (primary) hypertension: Secondary | ICD-10-CM | POA: Diagnosis not present

## 2021-06-02 DIAGNOSIS — E7800 Pure hypercholesterolemia, unspecified: Secondary | ICD-10-CM | POA: Diagnosis not present

## 2021-06-02 NOTE — Therapy (Signed)
[facility] Outpatient Rehabilitation [facility] [address] [place], [place], [address] Phone: [phone]   Fax:  [phone]  Physical Therapy Treatment  Patient Details  Name: Crystal Becker MRN: [id number] Date of Birth: 07/13/1956 Referring Provider (PT): Dr, Christena Deem   Encounter Date: 06/02/2021   PT End of Session - 06/02/21 1132     Visit Number 5    Number of Visits 16    Date for PT Re-Evaluation 07/15/21    Authorization Type [employer] MCR    PT Start Time 1132    PT Stop Time 1211    PT Time Calculation (min) 39 min    Activity Tolerance Patient limited by pain;Patient tolerated treatment well    Behavior During Therapy [facility] for tasks assessed/performed             No past medical history on file.  No past surgical history on file.  There were no vitals filed for this visit.   Subjective Assessment - 06/02/21 1132     Subjective Pt presents to PT with reports of improving symptoms, notes no current knee pain. Continues compliance with HEP with no adverse effect. REady to begin PT treatment at this time.    Currently in Pain? No/denies    Pain Score 0-No pain           [facility] Adult PT Treatment/Exercise:   Therapeutic Exercise:  NuStep lvl 7 UE/LE x 4 min while taking subjective Supported squats x 10 feet wide  Supine SLR 2 x10 neutral then toes out  Bridge w/ clamshell green tband 2x10 Supine hamstring stretch 2x30 sec ea w/ strap Standing hip abd/ext 2x10 - red band at ankles Lateral walk red tband 2x50ft Sit to stand green band 2 x 15 to correct valgus Total gym hamstring curl 2x10 20lbs Total gym knee ext 2x10 10lbs  Past Interventions Not Performed Today: LAQ with green band x 15 supine and seated (pain in supine)  LAQ 2x10 - 5 sec hold - 4 lb Supine quad set x 10 R LE - 5 sec hold                              PT Short Term Goals - 05/20/21 1515       PT SHORT TERM GOAL #1   Title Pt  will be I with HEP for Rt knee ROM and strength    Time 4    Period Weeks    Status New    Target Date 06/17/21      PT SHORT TERM GOAL #2   Title Pt will be able to 5 x sit to stand without UEs in 20 sec or less    Baseline 34 sec    Time 4    Period Weeks    Status New    Target Date 06/17/21      PT SHORT TERM GOAL #3   Title Pt will be able to perform straight leg raise with no more than 5 deg lag    Time 4    Period Weeks    Status New    Target Date 06/17/21      PT SHORT TERM GOAL #4   Title Pt will understand FOTO and potential to improve condition, function in Rt knee.    Time 4    Period Weeks    Status New    Target Date 06/17/21  PT Long Term Goals - 05/20/21 1521       PT LONG TERM GOAL #1   Title Pt will improve FOTO score to 60% or better to demo functional improvement    Time 8    Period Weeks    Status New    Target Date 07/15/21      PT LONG TERM GOAL #2   Title Pt will be able to bend knee to at least 120 deg with min R knee discomfort    Time 8    Period Weeks    Status New    Target Date 07/15/21      PT LONG TERM GOAL #3   Title Pt will demo Rt knee strength to 5/5 for improved gait, stairs and stability with yardwork, home tasks    Time 8    Period Weeks    Status New    Target Date 07/15/21      PT LONG TERM GOAL #4   Title Pt will be able to walk in the community as needed without limitation of pain an hour or more.    Time 8    Period Weeks    Status New                   Plan - 06/02/21 1200     Clinical Impression Statement Pt was able to complete prescribed exercises with no adverse effect or change in baseline. Continues to progress well with therapy, with today's session continuing to focus on increasing quad and proximal hip strength in order to reduce pain and improve mobility. She continues to benefit from skilled PT and will be progressed per POC.    PT Treatment/Interventions ADLs/Self Care  Home Management;Electrical Stimulation;Therapeutic activities;Patient/family education;Taping;Therapeutic exercise;Moist Heat;Ultrasound;Cryotherapy;Gait training;Functional mobility training;Manual techniques;Passive range of motion;Balance training;Vasopneumatic Device;Iontophoresis 4mg /ml Dexamethasone    PT Next Visit Plan progress LE strengthening and R knee ROM    PT Home Exercise Plan Access Code: [email or web address]             Patient will benefit from skilled therapeutic intervention in order to improve the following deficits and impairments:  Increased fascial restricitons, Impaired flexibility, Increased edema, Difficulty walking, Decreased balance, Decreased range of motion, Decreased strength, Hypomobility, Decreased mobility, Pain, Abnormal gait  Visit Diagnosis: Acute pain of right knee  Muscle weakness (generalized)  Localized edema  Stiffness of right knee, not elsewhere classified     Problem List There are no problems to display for this patient.   , PT 06/02/2021, 12:11 PM  [facility] [address] [place], [place], [place] Phone: [phone]   Fax:  [phone]  Name: MADISSEN WYSE MRN: Prentiss Bells Date of Birth: 03-18-1956

## 2021-06-07 ENCOUNTER — Ambulatory Visit: Payer: Medicare HMO | Admitting: Physical Therapy

## 2021-06-07 ENCOUNTER — Other Ambulatory Visit: Payer: Self-pay

## 2021-06-07 ENCOUNTER — Encounter: Payer: Self-pay | Admitting: Physical Therapy

## 2021-06-07 DIAGNOSIS — R600 Localized edema: Secondary | ICD-10-CM

## 2021-06-07 DIAGNOSIS — M25561 Pain in right knee: Secondary | ICD-10-CM | POA: Diagnosis not present

## 2021-06-07 DIAGNOSIS — M6281 Muscle weakness (generalized): Secondary | ICD-10-CM

## 2021-06-07 DIAGNOSIS — M25661 Stiffness of right knee, not elsewhere classified: Secondary | ICD-10-CM | POA: Diagnosis not present

## 2021-06-07 NOTE — Therapy (Signed)
[facility] [address] [place], [place], [address] Phone: [phone]   Fax:  [phone]  Physical Therapy Treatment  Patient Details  Name: Crystal Becker MRN: [id number] Date of Birth: 07-13-1956 Referring Provider (PT): Dr, Christena Deem   Encounter Date: 06/07/2021    History reviewed. No pertinent past medical history.  History reviewed. No pertinent surgical history.  There were no vitals filed for this visit.   Subjective Assessment - 06/07/21 1334     Subjective Yesterday was walking on unter[place] and that make it irritated.  That may have flared it up.  Did my exercises today.    Currently in Pain? Yes    Pain Score 2     Pain Location Knee    Pain Orientation Right    Pain Descriptors / Indicators Aching;Sore;Tightness    Pain Type Acute pain    Pain Onset More than a month ago    Pain Frequency Intermittent    Aggravating Factors  walking too much, bending, overactivity    Pain Relieving Factors rest, ice    Multiple Pain Sites No                [facility] PT Assessment - 06/07/21 0001       AROM   Right Knee Extension 2    Right Knee Flexion 114      Strength   Right Knee Flexion 5/5    Right Knee Extension 4+/5                Skilled therapy interventions:   Therapeutic Exercise:  Recumbant bike L2 , 5 min   Standing step downs , reverse from 6 inch steps x 15 , 1UE Lateral step ups 6 inch x 15  Standing split squat X 10  Lunge alternating reverse lunge x 8 each side Squat x 15 countertop  Lateral band walking green 6 x 15 feet   Wall sit 30 sec toe press then 30 sec heel press  Wall squats x 10 , fatigued   Hamstring bridge with bolster x 10 Knee ext for hamstring release x 10    Self care/Pt. Education:    FOTO and progress     PT Short Term Goals - 05/20/21 1515       PT SHORT TERM GOAL #1   Title Pt will be I with HEP for Rt knee ROM and strength    Time 4     Period Weeks    Status New    Target Date 06/17/21      PT SHORT TERM GOAL #2   Title Pt will be able to 5 x sit to stand without UEs in 20 sec or less    Baseline 34 sec    Time 4    Period Weeks    Status New    Target Date 06/17/21      PT SHORT TERM GOAL #3   Title Pt will be able to perform straight leg raise with no more than 5 deg lag    Time 4    Period Weeks    Status New    Target Date 06/17/21      PT SHORT TERM GOAL #4   Title Pt will understand FOTO and potential to improve condition, function in Rt knee.    Time 4    Period Weeks    Status New    Target Date 06/17/21  PT Long Term Goals - 05/20/21 1521       PT LONG TERM GOAL #1   Title Pt will improve FOTO score to 60% or better to demo functional improvement    Time 8    Period Weeks    Status New    Target Date 07/15/21      PT LONG TERM GOAL #2   Title Pt will be able to bend knee to at least 120 deg with min R knee discomfort    Time 8    Period Weeks    Status New    Target Date 07/15/21      PT LONG TERM GOAL #3   Title Pt will demo Rt knee strength to 5/5 for improved gait, stairs and stability with yardwork, home tasks    Time 8    Period Weeks    Status New    Target Date 07/15/21      PT LONG TERM GOAL #4   Title Pt will be able to walk in the community as needed without limitation of pain an hour or more.    Time 8    Period Weeks    Status New                   Plan - 06/07/21 1344     PT Treatment/Interventions ADLs/Self Care Home Management;Electrical Stimulation;Therapeutic activities;Patient/family education;Taping;Therapeutic exercise;Moist Heat;Ultrasound;Cryotherapy;Gait training;Functional mobility training;Manual techniques;Passive range of motion;Balance training;Vasopneumatic Device;Iontophoresis 4mg /ml Dexamethasone    PT Next Visit Plan progress LE strengthening and R knee ROM    PT Home Exercise Plan Access Code: [email or web address]     Consulted and Agree with Plan of Care Patient             Patient will benefit from skilled therapeutic intervention in order to improve the following deficits and impairments:  Increased fascial restricitons, Impaired flexibility, Increased edema, Difficulty walking, Decreased balance, Decreased range of motion, Decreased strength, Hypomobility, Decreased mobility, Pain, Abnormal gait  Visit Diagnosis: Acute pain of right knee  Muscle weakness (generalized)  Localized edema  Stiffness of right knee, not elsewhere classified     Problem List There are no problems to display for this patient.   Detrich Rakestraw, PT 06/07/2021, 1:44 PM  [facility] [address] [place], [place], [place] Phone: [phone]   Fax:  [phone]  Name: Crystal Becker MRN: Prentiss Bells Date of Birth: [birth year]

## 2021-06-10 ENCOUNTER — Ambulatory Visit: Payer: Medicare HMO

## 2021-06-10 ENCOUNTER — Other Ambulatory Visit: Payer: Self-pay

## 2021-06-10 DIAGNOSIS — R600 Localized edema: Secondary | ICD-10-CM

## 2021-06-10 DIAGNOSIS — M6281 Muscle weakness (generalized): Secondary | ICD-10-CM

## 2021-06-10 DIAGNOSIS — M25561 Pain in right knee: Secondary | ICD-10-CM

## 2021-06-10 DIAGNOSIS — M25661 Stiffness of right knee, not elsewhere classified: Secondary | ICD-10-CM | POA: Diagnosis not present

## 2021-06-10 NOTE — Therapy (Signed)
[facility] [address] [place], [place], [address] Phone: [phone]   Fax:  [phone]  Physical Therapy Treatment  Patient Details  Name: ZAHLIA DESHAZER MRN: [id number] Date of Birth: Jul 02, 1956 Referring Provider (PT): Dr, Christena Deem   Encounter Date: 06/10/2021   PT End of Session - 06/10/21 1357     Visit Number 7    Number of Visits 16    Date for PT Re-Evaluation 07/15/21    Authorization Type [employer] MCR    PT Start Time 1400    PT Stop Time 1440    PT Time Calculation (min) 40 min    Activity Tolerance Patient tolerated treatment well    Behavior During Therapy [facility] for tasks assessed/performed             No past medical history on file.  No past surgical history on file.  There were no vitals filed for this visit.   Subjective Assessment - 06/10/21 1403     Subjective Pt presents to PT with no current reports of pain, but did have some discomfort [place]. She is ready to begin PT treatment at this time.    Currently in Pain? No/denies    Pain Score 0-No pain           [facility] Adult PT Treatment/Exercise:   Therapeutic Exercise: NuStep lvl 5 UE/LE x 4 min while taking subjective  Standing step downs , reverse from 6 inch steps x 15 , 1UE Lateral step ups 6 inch x 15 ea Eccentric heel tap x 10 4 in ea STS split squat  2x10  ea Lunge alternating reverse lunge x 10 each side Squat x 15 countertop  S/l hip abd 2x15 2lb Lateral band walking green 6 x 15 feet  Wall sit 30 sec toe press then 30 sec heel press Wall squats 2x10 Hamstring bridge with ball x 10 Knee ext for hamstring release x 10                                PT Short Term Goals - 06/07/21 1414       PT SHORT TERM GOAL #1   Title Pt will be I with HEP for Rt knee ROM and strength    Status Achieved      PT SHORT TERM GOAL #2   Title Pt will be  able to 5 x sit to stand without UEs in 20 sec or less    Status Unable to assess      PT SHORT TERM GOAL #3   Title Pt will be able to perform straight leg raise with no more than 5 deg lag    Status Achieved      PT SHORT TERM GOAL #4   Title Pt will understand FOTO and potential to improve condition, function in Rt knee.    Status Achieved               PT Long Term Goals - 06/07/21 1415       PT LONG TERM GOAL #1   Title Pt will improve FOTO score to 60% or better to demo functional improvement    Baseline 75%    Status Achieved      PT LONG TERM GOAL #2   Title Pt will be able to bend knee to at least 120 deg with  min R knee discomfort    Baseline 114    Status On-going      PT LONG TERM GOAL #3   Title Pt will demo Rt knee strength to 5/5 for improved gait, stairs and stability with yardwork, home tasks    Baseline 4+/5 ext.    Status On-going      PT LONG TERM GOAL #4   Title Pt will be able to walk in the community as needed without limitation of pain an hour or more.    Status On-going                   Plan - 06/10/21 1409     Clinical Impression Statement Pt was able to complete prescribed exercises with no adverse effect or increase in pain noted. She is progressing very well with therapy, showing continued improvement in strength and activity tolerance. Today's session conitnued focus on strengthening of quads and proximal hip strength. PT will continue to progress execises as tolerated per POC with potential discharge soon as she is performing very well.    PT Treatment/Interventions ADLs/Self Care Home Management;Electrical Stimulation;Therapeutic activities;Patient/family education;Taping;Therapeutic exercise;Moist Heat;Ultrasound;Cryotherapy;Gait training;Functional mobility training;Manual techniques;Passive range of motion;Balance training;Vasopneumatic Device;Iontophoresis 4mg /ml Dexamethasone    PT Next Visit Plan progress LE strengthening  and R knee ROM    PT Home Exercise Plan Access Code: [email or web address]             Patient will benefit from skilled therapeutic intervention in order to improve the following deficits and impairments:  Increased fascial restricitons, Impaired flexibility, Increased edema, Difficulty walking, Decreased balance, Decreased range of motion, Decreased strength, Hypomobility, Decreased mobility, Pain, Abnormal gait  Visit Diagnosis: Acute pain of right knee  Muscle weakness (generalized)  Localized edema  Stiffness of right knee, not elsewhere classified     Problem List There are no problems to display for this patient.   , PT 06/10/2021, 2:43 PM  [facility] [address] [place], [place], [place] Phone: [phone]   Fax:  [phone]  Name: LAVINIA MCNEELY MRN: Prentiss Bells Date of Birth: 07-04-1956

## 2021-06-14 ENCOUNTER — Ambulatory Visit: Payer: Medicare HMO | Admitting: Physical Therapy

## 2021-06-14 ENCOUNTER — Other Ambulatory Visit: Payer: Self-pay

## 2021-06-14 DIAGNOSIS — M6281 Muscle weakness (generalized): Secondary | ICD-10-CM | POA: Diagnosis not present

## 2021-06-14 DIAGNOSIS — M25661 Stiffness of right knee, not elsewhere classified: Secondary | ICD-10-CM | POA: Diagnosis not present

## 2021-06-14 DIAGNOSIS — R600 Localized edema: Secondary | ICD-10-CM

## 2021-06-14 DIAGNOSIS — M25561 Pain in right knee: Secondary | ICD-10-CM

## 2021-06-14 NOTE — Therapy (Signed)
[facility] [place], [place], [address] Phone: [phone]   Fax:  [phone]  Physical Therapy Treatment/Discharge  Patient Details  Name: Crystal Becker MRN: [id number] Date of Birth: 08-Jan-1956 Referring Provider (PT): Dr, Inez Catalina   Encounter Date: 06/14/2021   PT End of Session - 06/14/21 1422     Visit Number 8    Number of Visits 16    Date for PT Re-Evaluation 07/15/21    Authorization Type [employer] MCR    PT Start Time 9458    PT Stop Time 1456    PT Time Calculation (min) 41 min    Activity Tolerance Patient tolerated treatment well    Behavior During Therapy [facility] for tasks assessed/performed             No past medical history on file.  No past surgical history on file.  There were no vitals filed for this visit.   Subjective Assessment - 06/14/21 1417     Subjective No pain.  Had a busy weekend .  Does her exercises daily    Currently in Pain? No/denies                [facility] PT Assessment - 06/14/21 0001       AROM   Right Knee Flexion 119      Strength   Right Knee Flexion 5/5    Right Knee Extension 5/5             Skilled therapy interventions:   Therapeutic Exercise:  Nustep L5 UE and LE for 7 min   Sit to stand x 10 , 10 lbs   5 x STS x 10.5 sec   Step ups Rt 8 inch step 1 UE hold with 10 lbs   Reverse step down Rt LE x 15 8 inch  Reverse lunge to single leg lift for balance x 10 each side   Foam pad hip abduction x 15 each LE     Bridge x 15   Single leg bridge  Bridge with march x 10   Supine alternating knee and hip ext (core ) x 10   Dead bug x 10 , fatigue no pain   MMT, ROM check. Goals.  Agrees to DC               PT Education - 06/14/21 1457     Education Details Discharge, goals, progress    Person(s) Educated Patient    Methods Explanation;Demonstration;Verbal cues    Comprehension Verbalized understanding               PT Short Term Goals - 06/14/21 1423       PT SHORT TERM GOAL #1   Title Pt will be I with HEP for Rt knee ROM and strength    Status Achieved      PT SHORT TERM GOAL #2   Title Pt will be able to 5 x sit to stand without UEs in 20 sec or less    Baseline 10 .5 sec    Status Achieved      PT SHORT TERM GOAL #3   Title Pt will be able to perform straight leg raise with no more than 5 deg lag    Status Achieved      PT SHORT TERM GOAL #4   Title Pt will understand FOTO and potential to improve condition, function in Rt knee.    Status Achieved  PT Long Term Goals - 06/14/21 1428       PT LONG TERM GOAL #1   Title Pt will improve FOTO score to 60% or better to demo functional improvement    Status Achieved      PT LONG TERM GOAL #2   Title Pt will be able to bend knee to at least 120 deg with min R knee discomfort    Status Achieved      PT LONG TERM GOAL #3   Title Pt will demo Rt knee strength to 5/5 for improved gait, stairs and stability with yardwork, home tasks    Status Achieved      PT LONG TERM GOAL #4   Title Pt will be able to walk in the community as needed without limitation of pain an hour or more.    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   She has mild limitations in balance and stability (knee and pelvis). She has met all of her goals and was agreeable to early discharge today.  Strength is 5/5 and ROM flexion to 120 deg.  She sees Dr. Sheppard Coil Monday. Dc from PT    PT Next Visit Plan DC    PT Home Exercise Plan Access Code: [email or web address]    Consulted and Agree with Plan of Care Patient             Patient will benefit from skilled therapeutic intervention in order to improve the following deficits and impairments:     Visit Diagnosis: Acute pain of right knee  Muscle  weakness (generalized)  Localized edema  Stiffness of right knee, not elsewhere classified     Problem List There are no problems to display for this patient.   Johnross Nabozny, PT 06/14/2021, 3:03 PM  [facility] [address] [place], [place], [address] Phone: [phone]   Fax:  [phone]  Name: Crystal Becker MRN: [id number] Date of Birth: 04-Jul-1956  PHYSICAL THERAPY DISCHARGE SUMMARY  Visits from Start of Care: 8  Current functional level related to goals / functional outcomes: See above    Remaining deficits: None limiting function   Education / Equipment: HEP, stability, RICE , activity    Patient agrees to discharge. Patient goals were met. Patient is being discharged due to meeting the stated rehab goals.  Raeford Razor, PT 06/14/21 3:03 PM Phone: [phone] Fax: [phone]

## 2021-06-17 ENCOUNTER — Encounter: Payer: Medicare HMO | Admitting: Physical Therapy

## 2021-06-21 DIAGNOSIS — M25561 Pain in right knee: Secondary | ICD-10-CM | POA: Diagnosis not present

## 2021-06-28 DIAGNOSIS — H66002 Acute suppurative otitis media without spontaneous rupture of ear drum, left ear: Secondary | ICD-10-CM | POA: Diagnosis not present

## 2021-10-26 DIAGNOSIS — I10 Essential (primary) hypertension: Secondary | ICD-10-CM | POA: Diagnosis not present

## 2021-10-26 DIAGNOSIS — R0602 Shortness of breath: Secondary | ICD-10-CM | POA: Diagnosis not present

## 2021-10-26 DIAGNOSIS — R002 Palpitations: Secondary | ICD-10-CM | POA: Diagnosis not present

## 2021-10-27 ENCOUNTER — Encounter: Payer: Self-pay | Admitting: *Deleted

## 2021-10-27 ENCOUNTER — Other Ambulatory Visit: Payer: Self-pay | Admitting: *Deleted

## 2021-10-27 ENCOUNTER — Ambulatory Visit (INDEPENDENT_AMBULATORY_CARE_PROVIDER_SITE_OTHER): Payer: No Typology Code available for payment source

## 2021-10-27 DIAGNOSIS — R002 Palpitations: Secondary | ICD-10-CM

## 2021-10-27 NOTE — Progress Notes (Unsigned)
Enrolled for [employer] to mail a ZIO XT long term holter monitor to the patients address on file.  Letter with instructions mailed to patient. 

## 2021-10-28 ENCOUNTER — Telehealth: Payer: Self-pay

## 2021-10-28 NOTE — Telephone Encounter (Signed)
Notes scanned to referral 

## 2021-11-05 DIAGNOSIS — Z1231 Encounter for screening mammogram for malignant neoplasm of breast: Secondary | ICD-10-CM | POA: Diagnosis not present

## 2021-11-06 DIAGNOSIS — R002 Palpitations: Secondary | ICD-10-CM

## 2021-11-25 DIAGNOSIS — R002 Palpitations: Secondary | ICD-10-CM | POA: Diagnosis not present

## 2021-12-20 DIAGNOSIS — J069 Acute upper respiratory infection, unspecified: Secondary | ICD-10-CM | POA: Diagnosis not present

## 2021-12-20 DIAGNOSIS — R110 Nausea: Secondary | ICD-10-CM | POA: Diagnosis not present

## 2022-01-06 NOTE — Progress Notes (Signed)
?Cardiology Office Note:   ? ?Date:  01/07/2022  ? ?ID:  Crystal Becker, DOB 1955-10-13, MRN [id number] ? ?PCP:  Clovis Riley, L.August Saucer, MD  ? ?[facility] Providers ?Cardiologist:  Alverda Skeans, MD ?Referring MD: Milus Height, PA  ? ?Chief Complaint/Reason for Referral: Palpitations ? ?ASSESSMENT:   ? ?1. Palpitations   ?2. Hypertension, unspecified type   ? ? ?PLAN:   ? ?In order of problems listed above: ?1.  Palpitations: We will obtain echocardiogram to evaluate further.  She has a relatively benign results on her monitor.  I agree with metoprolol patient is excessively symptomatic.  Her EKG today demonstrates sinus tachycardia with frequent PVCs.  Increase her Toprol to 37.5 mg.  She had a TSH in February that was within normal limits.  Given sinus tachycardia we will check a CBC and CMP.  If she continues to be symptomatic with this PVC burden I will refer her to electrophysiology.  Follow-up in 6 months or earlier if needed. ?2.  Hypertension:  BP is well controlled today.  She has ankle edema and likely in response to Norvasc.  We will stop Norvasc and start losartan 25 mg every night. ? ? ?   ? ?Dispo:  Return in about 6 months (around 07/10/2022).  ? ?  ? ?Medication Adjustments/Labs and Tests Ordered: ?Current medicines are reviewed at length with the patient today.  Concerns regarding medicines are outlined above. ? ?The following changes have been made:    ? ?Labs/tests ordered: ?No orders of the defined types were placed in this encounter. ? ? ?Medication Changes: ?No orders of the defined types were placed in this encounter. ? ? ? ?Current medicines are reviewed at length with the patient today.  The patient does not have concerns regarding medicines. ? ? ?History of Present Illness:   ? ?FOCUSED PROBLEM LIST:   ?1.  Hypertension ? ?The patient is a 66 y.o. female with the indicated medical history here for recommendations regarding recent monitor results.  Patient has not seen a monitor placed  March which demonstrated a few runs of nonsustained ventricular tachycardia as well as supraventricular tachycardia with no sustained tachyarrhythmias, bradyarrhythmias, or atrial fibrillation.  Patient seem to be symptomatic sinus rhythm, bigeminy, and trigeminy.  She was started on Toprol XL. ? ?The Toprol XL has helped however she has noticed continued palpitations maybe 3 or 4 nights a week.  She denies any exertional angina or dyspnea however she does have chest tightness with the palpitations.  She has had no presyncope or syncope, severe bleeding or bruising, or signs or symptoms of stroke.  She has has not required emergency room visits or hospitalizations. ? ?   ? ? ? ?Current Medications: ?Current Meds  ?Medication Sig  ? acetaminophen (TYLENOL) 325 MG tablet Take 650 mg by mouth every 6 (six) hours as needed for moderate pain.  ? CALCIUM-MAGNESIUM-ZINC PO Take 1 tablet by mouth daily.  ? Capsicum, Cayenne, (CAYENNE PO) Take 1 capsule by mouth daily.  ? CINNAMON PO Take 1 tablet by mouth daily.  ? Garlic (GARLIQUE PO) Take 1 tablet by mouth daily.  ? Glucosamine-Chondroitin (MOVE FREE PO) Take 1 capsule by mouth daily.  ? Multiple Vitamin (MULTIVITAMIN) tablet Take 1 tablet by mouth daily.  ? ondansetron (ZOFRAN) 8 MG tablet Take by mouth every 8 (eight) hours as needed for nausea or vomiting.  ? Turmeric (QC TUMERIC COMPLEX PO) Take by mouth.  ? [DISCONTINUED] amLODipine (NORVASC) 10 MG tablet Take 10 mg  by mouth daily.  ? [DISCONTINUED] metoprolol succinate (TOPROL-XL) 25 MG 24 hr tablet Take 25 mg by mouth daily.  ?  ? ?Allergies:    ?Mobic [meloxicam], Hydrochlorothiazide, and Lisinopril  ? ?Social History:   ?Social History  ? ?Tobacco Use  ? Smoking status: Former  ?  Types: Cigarettes  ?  Quit date: 12/24/1996  ?  Years since quitting: 25.0  ?  Passive exposure: Past  ? Smokeless tobacco: Never  ?Substance Use Topics  ? Alcohol use: Not Currently  ?  ? ?Family Hx: ?History reviewed. No pertinent  family history.  ? ?Review of Systems:   ?Please see the history of present illness.    ?All other systems reviewed and are negative. ?  ? ? ?EKGs/Labs/Other Test Reviewed:   ? ?EKG:  EKG performed February 2023 that I personally reviewed demonstrates sinus rhythm; EKG performed today demonstrates sinus tachycardia with frequent PVCs ? ?Prior CV studies: ? ?Monitor 2023 demonstrated 6 zones of nonsustained ventricular tachycardia and 9 runs of supraventricular tachycardia with no sustained ? ?Other studies Reviewed: ?Review of the additional studies/records demonstrates: Chest CT 2022 without aortic pathology ? ?Recent Labs: ?04/15/2021: B Natriuretic Peptide 14.7; BUN 14; Creatinine, Ser 0.81; Hemoglobin 12.4; Magnesium 2.4; Platelets 287; Potassium 4.2; Sodium 137  ? ?Recent Lipid Panel ?No results found for: CHOL, TRIG, HDL, LDLCALC, LDLDIRECT ? ?Risk Assessment/Calculations:   ? ?  ?    ? ?Physical Exam:   ? ?VS:  BP 138/74   Pulse 68   Ht 5' 5.5" (1.664 m)   Wt 163 lb (73.9 kg)   SpO2 99%   BMI 26.71 kg/m?    ?Wt Readings from Last 3 Encounters:  ?01/07/22 163 lb (73.9 kg)  ?  ?GENERAL:  No apparent distress, AOx3 ?HEENT:  No carotid bruits, +2 carotid impulses, no scleral icterus ?CAR: Irregular RR no murmurs, gallops, rubs, or thrills ?RES:  Clear to auscultation bilaterally ?ABD:  Soft, nontender, nondistended, positive bowel sounds x 4 ?VASC:  +2 radial pulses, +2 carotid pulses, palpable pedal pulses ?NEURO:  CN 2-12 grossly intact; motor and sensory grossly intact ?PSYCH:  No active depression or anxiety ?EXT: Ankle edema is present but no ecchymosis, or cyanosis ? ?Signed, ?Orbie Pyo, MD  ?01/07/2022 3:08 PM    ?[facility] ?[address] [place], [place], [place]  [address] ?Phone: [phone]; Fax: [phone]  ? ?Note:  This document was prepared using Dragon voice recognition software and may include unintentional dictation errors. ?

## 2022-01-07 ENCOUNTER — Ambulatory Visit (INDEPENDENT_AMBULATORY_CARE_PROVIDER_SITE_OTHER): Payer: No Typology Code available for payment source | Admitting: Internal Medicine

## 2022-01-07 ENCOUNTER — Encounter: Payer: Self-pay | Admitting: Internal Medicine

## 2022-01-07 VITALS — BP 138/74 | HR 68 | Ht 65.5 in | Wt 163.0 lb

## 2022-01-07 DIAGNOSIS — I10 Essential (primary) hypertension: Secondary | ICD-10-CM

## 2022-01-07 DIAGNOSIS — R002 Palpitations: Secondary | ICD-10-CM

## 2022-01-07 MED ORDER — LOSARTAN POTASSIUM 25 MG PO TABS: 25.0000 mg | ORAL_TABLET | Freq: Every day | ORAL | 3 refills | Status: DC

## 2022-01-07 MED ORDER — METOPROLOL SUCCINATE ER 25 MG PO TB24: 37.5000 mg | ORAL_TABLET | Freq: Every day | ORAL | 3 refills | Status: DC

## 2022-01-07 NOTE — Patient Instructions (Signed)
Medication Instructions:  ?Your physician has recommended you make the following change in your medication:  ?1.) stop amlodipine ?2.) start losartan 25 mg - one tablet daily at bedtime ?3.) increase Toprol XL to 37.5 mg (ONE AND HALF TABLETS) once daily at bedtime ? ?*If you need a refill on your cardiac medications before your next appointment, please call your pharmacy* ? ? ?Lab Work: ?CBC, CMET ? ? ?Testing/Procedures: ?Your physician has requested that you have an echocardiogram. Echocardiography is a painless test that uses sound waves to create images of your heart. It provides your doctor with information about the size and shape of your heart and how well your heart?s chambers and valves are working. This procedure takes approximately one hour. There are no restrictions for this procedure. ? ? ?Follow-Up: ?At [facility], you and your health needs are our priority.  As part of our continuing mission to provide you with exceptional heart care, we have created designated Provider Care Teams.  These Care Teams include your primary Cardiologist (physician) and Advanced Practice Providers (APPs -  Physician Assistants and Nurse Practitioners) who all work together to provide you with the care you need, when you need it. ? ?We recommend signing up for the patient portal called "MyChart".  Sign up information is provided on this After Visit Summary.  MyChart is used to connect with patients for Virtual Visits (Telemedicine).  Patients are able to view lab/test results, encounter notes, upcoming appointments, etc.  Non-urgent messages can be sent to your provider as well.   ?To learn more about what you can do with MyChart, go to [email or web address].   ? ?Your next appointment:   ?6 month(s) ? ?The format for your next appointment:   ?In Person ? ?Provider:   ?Orbie Pyo, MD   ? ? ?Other Instructions ? ? ?Important Information About Sugar ? ? ? ? ?  ?

## 2022-01-09 LAB — CBC
Hematocrit: 39.8 % (ref 34.0–46.6)
Hemoglobin: 13.3 g/dL (ref 11.1–15.9)
MCH: 27.6 pg (ref 26.6–33.0)
MCHC: 33.4 g/dL (ref 31.5–35.7)
MCV: 83 fL (ref 79–97)
Platelets: 266 10*3/uL (ref 150–450)
RBC: 4.82 x10E6/uL (ref 3.77–5.28)
RDW: 13.1 % (ref 11.7–15.4)
WBC: 8.2 10*3/uL (ref 3.4–10.8)

## 2022-01-09 LAB — COMPREHENSIVE METABOLIC PANEL
ALT: 11 IU/L (ref 0–32)
AST: 19 IU/L (ref 0–40)
Albumin/Globulin Ratio: 1.7 (ref 1.2–2.2)
Albumin: 4.7 g/dL (ref 3.8–4.8)
Alkaline Phosphatase: 101 IU/L (ref 44–121)
BUN/Creatinine Ratio: 10 — ABNORMAL LOW (ref 12–28)
BUN: 10 mg/dL (ref 8–27)
Bilirubin Total: <0.2 mg/dL (ref 0.0–1.2)
CO2: 16 mmol/L — ABNORMAL LOW (ref 20–29)
Calcium: 9.9 mg/dL (ref 8.7–10.3)
Chloride: 110 mmol/L — ABNORMAL HIGH (ref 96–106)
Creatinine, Ser: 1.05 mg/dL — ABNORMAL HIGH (ref 0.57–1.00)
Globulin, Total: 2.7 g/dL (ref 1.5–4.5)
Glucose: 90 mg/dL (ref 70–99)
Potassium: 3.9 mmol/L (ref 3.5–5.2)
Sodium: 148 mmol/L — ABNORMAL HIGH (ref 134–144)
Total Protein: 7.4 g/dL (ref 6.0–8.5)
eGFR: 58 mL/min/{1.73_m2} — ABNORMAL LOW (ref >59)

## 2022-01-17 ENCOUNTER — Telehealth: Payer: Self-pay | Admitting: Internal Medicine

## 2022-01-17 NOTE — Telephone Encounter (Signed)
I spoke with patient and reviewed recent lab results with her.

## 2022-01-17 NOTE — Telephone Encounter (Signed)
Patient was returning call. Please advise ?

## 2022-01-25 ENCOUNTER — Ambulatory Visit (HOSPITAL_COMMUNITY): Payer: No Typology Code available for payment source | Attending: Cardiovascular Disease

## 2022-01-25 DIAGNOSIS — I10 Essential (primary) hypertension: Secondary | ICD-10-CM | POA: Diagnosis not present

## 2022-01-25 DIAGNOSIS — R002 Palpitations: Secondary | ICD-10-CM | POA: Insufficient documentation

## 2022-01-25 LAB — ECHOCARDIOGRAM COMPLETE
Area-P 1/2: 4.39 cm2
S' Lateral: 3.2 cm

## 2022-01-26 ENCOUNTER — Telehealth: Payer: Self-pay | Admitting: Internal Medicine

## 2022-01-26 NOTE — Telephone Encounter (Signed)
Pt returning nurses call regarding test results. Please advise 

## 2022-01-26 NOTE — Telephone Encounter (Signed)
Echo results provided to patient.

## 2022-05-16 DIAGNOSIS — I10 Essential (primary) hypertension: Secondary | ICD-10-CM | POA: Diagnosis not present

## 2022-06-03 DIAGNOSIS — E7800 Pure hypercholesterolemia, unspecified: Secondary | ICD-10-CM | POA: Diagnosis not present

## 2022-06-03 DIAGNOSIS — Z0000 Encounter for general adult medical examination without abnormal findings: Secondary | ICD-10-CM | POA: Diagnosis not present

## 2022-06-03 DIAGNOSIS — Z13820 Encounter for screening for osteoporosis: Secondary | ICD-10-CM | POA: Diagnosis not present

## 2022-06-03 DIAGNOSIS — M25572 Pain in left ankle and joints of left foot: Secondary | ICD-10-CM | POA: Diagnosis not present

## 2022-06-03 DIAGNOSIS — I10 Essential (primary) hypertension: Secondary | ICD-10-CM | POA: Diagnosis not present

## 2022-06-07 DIAGNOSIS — M25572 Pain in left ankle and joints of left foot: Secondary | ICD-10-CM | POA: Diagnosis not present

## 2022-06-17 DIAGNOSIS — Z780 Asymptomatic menopausal state: Secondary | ICD-10-CM | POA: Diagnosis not present

## 2022-06-17 DIAGNOSIS — M8589 Other specified disorders of bone density and structure, multiple sites: Secondary | ICD-10-CM | POA: Diagnosis not present

## 2022-07-12 ENCOUNTER — Emergency Department (HOSPITAL_BASED_OUTPATIENT_CLINIC_OR_DEPARTMENT_OTHER): Payer: No Typology Code available for payment source | Admitting: Radiology

## 2022-07-12 ENCOUNTER — Other Ambulatory Visit: Payer: Self-pay

## 2022-07-12 ENCOUNTER — Emergency Department (HOSPITAL_BASED_OUTPATIENT_CLINIC_OR_DEPARTMENT_OTHER)
Admission: EM | Admit: 2022-07-12 | Discharge: 2022-07-12 | Disposition: A | Discharge status: Home / Self Care | Payer: No Typology Code available for payment source | Attending: Emergency Medicine | Admitting: Emergency Medicine

## 2022-07-12 ENCOUNTER — Encounter (HOSPITAL_BASED_OUTPATIENT_CLINIC_OR_DEPARTMENT_OTHER): Payer: Self-pay | Admitting: Emergency Medicine

## 2022-07-12 DIAGNOSIS — S62355A Nondisplaced fracture of shaft of fourth metacarpal bone, left hand, initial encounter for closed fracture: Secondary | ICD-10-CM | POA: Insufficient documentation

## 2022-07-12 DIAGNOSIS — Z87891 Personal history of nicotine dependence: Secondary | ICD-10-CM | POA: Insufficient documentation

## 2022-07-12 DIAGNOSIS — Z79899 Other long term (current) drug therapy: Secondary | ICD-10-CM | POA: Diagnosis not present

## 2022-07-12 DIAGNOSIS — Y92410 Unspecified street and highway as the place of occurrence of the external cause: Secondary | ICD-10-CM | POA: Diagnosis not present

## 2022-07-12 DIAGNOSIS — M79642 Pain in left hand: Secondary | ICD-10-CM | POA: Diagnosis not present

## 2022-07-12 DIAGNOSIS — S62325A Displaced fracture of shaft of fourth metacarpal bone, left hand, initial encounter for closed fracture: Secondary | ICD-10-CM | POA: Diagnosis not present

## 2022-07-12 DIAGNOSIS — M79632 Pain in left forearm: Secondary | ICD-10-CM | POA: Diagnosis not present

## 2022-07-12 DIAGNOSIS — S6992XA Unspecified injury of left wrist, hand and finger(s), initial encounter: Secondary | ICD-10-CM | POA: Diagnosis present

## 2022-07-12 DIAGNOSIS — I10 Essential (primary) hypertension: Secondary | ICD-10-CM | POA: Diagnosis not present
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
## 2022-07-12 NOTE — ED Notes (Signed)
Discharge instructions, follow up care with ortho, and pain management reviewed and explained. Pt verbalized understanding and had no further questions on d/c. Splint placed prior to d/c.

## 2022-07-12 NOTE — Discharge Instructions (Signed)
We evaluated you for your hand pain.  Your x-ray shows a fracture of your fourth metacarpal (ring finger).  We have placed you in a splint.  Please call orthopedic hand surgery for follow-up.  Please take Tylenol 1000 mg every 6 hours as needed for pain.  You can also apply ice to your hand.  Please return if you develop severe pain, tingling, your splint feels too tight, or you have any other concerning symptoms.
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
## 2022-07-18 DIAGNOSIS — S62325A Displaced fracture of shaft of fourth metacarpal bone, left hand, initial encounter for closed fracture: Secondary | ICD-10-CM | POA: Diagnosis not present

## 2022-07-25 ENCOUNTER — Ambulatory Visit: Payer: No Typology Code available for payment source | Admitting: Internal Medicine

## 2022-08-01 ENCOUNTER — Ambulatory Visit: Payer: No Typology Code available for payment source | Admitting: Internal Medicine

## 2022-08-01 DIAGNOSIS — S62355D Nondisplaced fracture of shaft of fourth metacarpal bone, left hand, subsequent encounter for fracture with routine healing: Secondary | ICD-10-CM | POA: Diagnosis not present

## 2022-08-02 ENCOUNTER — Telehealth: Payer: Self-pay | Admitting: Internal Medicine

## 2022-08-02 DIAGNOSIS — M7989 Other specified soft tissue disorders: Secondary | ICD-10-CM | POA: Diagnosis not present

## 2022-08-02 DIAGNOSIS — S62355D Nondisplaced fracture of shaft of fourth metacarpal bone, left hand, subsequent encounter for fracture with routine healing: Secondary | ICD-10-CM | POA: Diagnosis not present

## 2022-08-02 NOTE — Telephone Encounter (Signed)
Pt c/o BP issue: STAT if pt c/o blurred vision, one-sided weakness or slurred speech  1. What are your last 5 BP readings? 188/113 158/97 181/113  2. Are you having any other symptoms (ex. Dizziness, headache, blurred vision, passed out)? Headache   3. What is your BP issue? Pt states that she is having issues with high bp that she can not seem to go down. Requesting call back.

## 2022-08-02 NOTE — Telephone Encounter (Signed)
Left message for patient to call back  
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
## 2022-08-03 NOTE — Telephone Encounter (Signed)
Follow Up:      Patient is returning a call from yesterday. 

## 2022-08-05 DIAGNOSIS — Z6827 Body mass index (BMI) 27.0-27.9, adult: Secondary | ICD-10-CM | POA: Diagnosis not present

## 2022-08-05 DIAGNOSIS — I77819 Aortic ectasia, unspecified site: Secondary | ICD-10-CM | POA: Diagnosis not present

## 2022-08-05 DIAGNOSIS — E663 Overweight: Secondary | ICD-10-CM | POA: Diagnosis not present

## 2022-08-05 DIAGNOSIS — F17211 Nicotine dependence, cigarettes, in remission: Secondary | ICD-10-CM | POA: Diagnosis not present

## 2022-08-05 DIAGNOSIS — I10 Essential (primary) hypertension: Secondary | ICD-10-CM | POA: Diagnosis not present

## 2022-08-05 DIAGNOSIS — E785 Hyperlipidemia, unspecified: Secondary | ICD-10-CM | POA: Diagnosis not present

## 2022-08-05 DIAGNOSIS — Z008 Encounter for other general examination: Secondary | ICD-10-CM | POA: Diagnosis not present

## 2022-08-15 ENCOUNTER — Telehealth: Payer: Self-pay | Admitting: Internal Medicine

## 2022-08-15 DIAGNOSIS — S62355D Nondisplaced fracture of shaft of fourth metacarpal bone, left hand, subsequent encounter for fracture with routine healing: Secondary | ICD-10-CM | POA: Diagnosis not present

## 2022-08-15 NOTE — Telephone Encounter (Signed)
Pt c/o BP issue: STAT if pt c/o blurred vision, one-sided weakness or slurred speech  1. What are your last 5 BP readings?   Pt states she takes her bp meds at night and she took all these readings in the morning  12/12: 163/105 12/14: 174/105 12/15: 164/105 12/16: 155/107 12/17: 177/114    2. Are you having any other symptoms (ex. Dizziness, headache, blurred vision, passed out)?   No, "little headache"   3. What is your BP issue? Pt states her bp is still high and wants to know what she should do

## 2022-08-15 NOTE — Telephone Encounter (Signed)
Left a message to call back.

## 2022-08-16 NOTE — Telephone Encounter (Signed)
The patient was previously taking amlodipine 10 mg which was stopped at last ov due to ankle swelling.  At that point losartan 25 mg was added.   Her Toprol XL was also increased to 37.5 mg due to symptomatic PVCs.      Will route to Dr. Lynnette Caffey for recommendations.

## 2022-08-16 NOTE — Telephone Encounter (Signed)
See phone encounter 12/18 for more details re: bp.

## 2022-08-17 ENCOUNTER — Telehealth: Payer: Self-pay | Admitting: Internal Medicine

## 2022-08-17 DIAGNOSIS — I10 Essential (primary) hypertension: Secondary | ICD-10-CM

## 2022-08-17 NOTE — Telephone Encounter (Signed)
See telephone encounter from today for update/more details.

## 2022-08-17 NOTE — Telephone Encounter (Signed)
I spoke with patient.  Reading yesterday was 177/144 and today was 174/115.  Per previous phone note Dr Crystal Becker recommends patient increase losartan to 50 mg daily.  Patient reports she is currently taking losartan 100 mg daily and Metoprolol ER 50 mg daily.  Patient reports losartan was increased by Crystal Becker (PA at Dr Crystal Becker office).  Patient reports her bottle of metoprolol was prescribed by Crystal Becker also.  She does not have metoprolol prescription from Dr Crystal Becker.  Will forward to Dr Crystal Becker for review/recommendations.

## 2022-08-17 NOTE — Telephone Encounter (Signed)
Pt c/o BP issue: STAT if pt c/o blurred vision, one-sided weakness or slurred speech  1. What are your last 5 BP readings? 177/144; 174/115  2. Are you having any other symptoms (ex. Dizziness, headache, blurred vision, passed out)? Headache; nauseated  3. What is your BP issue? Still experiencing HBP

## 2022-08-18 NOTE — Telephone Encounter (Signed)
Crystal Pyo, MD  [facility] Triage2 hours ago (6:39 AM)   Lets have her see pharmacy for BP management   Called and spoke with pt regarding her BP and Dr Trula Ore order for her to be seen in the HTN clinic here.  Pt is agreeable to being seen and will await a call back for an appt.  This AM her BP was 161/101 at approximately 7:30 am.  She takes both Metoprolol and Losartan at night around 11 pm.  Advised pt to continue taking Metoprolol at night but take Losartan in the morning and continue to monitor BP.  Pt states understanding.  She will c/b if BP continues to be elevated or if she has any further questions prior to eval in the HTN clinic.

## 2022-08-26 DIAGNOSIS — I10 Essential (primary) hypertension: Secondary | ICD-10-CM | POA: Diagnosis not present

## 2022-08-31 NOTE — Telephone Encounter (Signed)
Appointment 09/16/21 with HTN clinic.

## 2022-09-06 DIAGNOSIS — S62355D Nondisplaced fracture of shaft of fourth metacarpal bone, left hand, subsequent encounter for fracture with routine healing: Secondary | ICD-10-CM | POA: Diagnosis not present

## 2022-09-07 DIAGNOSIS — Z6827 Body mass index (BMI) 27.0-27.9, adult: Secondary | ICD-10-CM | POA: Diagnosis not present

## 2022-09-07 DIAGNOSIS — I10 Essential (primary) hypertension: Secondary | ICD-10-CM | POA: Diagnosis not present

## 2022-09-07 DIAGNOSIS — Z008 Encounter for other general examination: Secondary | ICD-10-CM | POA: Diagnosis not present

## 2022-09-07 DIAGNOSIS — E663 Overweight: Secondary | ICD-10-CM | POA: Diagnosis not present

## 2022-09-08 DIAGNOSIS — E7800 Pure hypercholesterolemia, unspecified: Secondary | ICD-10-CM | POA: Diagnosis not present
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
## 2022-09-13 ENCOUNTER — Ambulatory Visit: Payer: No Typology Code available for payment source | Attending: Sports Medicine | Admitting: Occupational Therapy

## 2022-09-13 ENCOUNTER — Encounter: Payer: Self-pay | Admitting: Occupational Therapy

## 2022-09-13 DIAGNOSIS — M25632 Stiffness of left wrist, not elsewhere classified: Secondary | ICD-10-CM

## 2022-09-13 DIAGNOSIS — R278 Other lack of coordination: Secondary | ICD-10-CM

## 2022-09-13 DIAGNOSIS — R600 Localized edema: Secondary | ICD-10-CM | POA: Diagnosis not present

## 2022-09-13 DIAGNOSIS — M25642 Stiffness of left hand, not elsewhere classified: Secondary | ICD-10-CM | POA: Diagnosis not present

## 2022-09-13 DIAGNOSIS — M79642 Pain in left hand: Secondary | ICD-10-CM | POA: Diagnosis not present

## 2022-09-13 DIAGNOSIS — M6281 Muscle weakness (generalized): Secondary | ICD-10-CM | POA: Diagnosis not present

## 2022-09-13 DIAGNOSIS — R208 Other disturbances of skin sensation: Secondary | ICD-10-CM | POA: Diagnosis not present

## 2022-09-13 NOTE — Patient Instructions (Signed)
  Flexor Tendon Gliding (Active Hook Fist)   With fingers and knuckles straight, bend middle and tip joints. Do not bend large knuckles. Repeat _10-15___ times. Do _4-6___ sessions per day.  MP Flexion (Active)   Bend large knuckles as far as they will go, keeping small joints of fingers straight. Repeat _10-15___ times. Do __4-6__ sessions per day.      Finger Flexion / Extension   Bend fingers of left hand toward palm, making a  fist. Straighten fingers, opening fist. Repeat sequence _10-15___ times per session. Do _4-6__ sessions per day.  AROM: Wrist Extension    With left palm down, bend wrist up. Repeat __10__ times per set. Do __4-6__ sets per day.  WRIST: Flexion Against Gravity    Rest arm and hand on surface, palm up. Raise wrist up. 10___ reps per set, _4-6__ sets per day

## 2022-09-16 ENCOUNTER — Ambulatory Visit
Payer: No Typology Code available for payment source | Attending: Cardiology | Admitting: Pharmacist Clinician (PhC)/ Clinical Pharmacy Specialist

## 2022-09-16 VITALS — BP 156/90

## 2022-09-16 DIAGNOSIS — I10 Essential (primary) hypertension: Secondary | ICD-10-CM | POA: Diagnosis not present

## 2022-09-16 MED ORDER — VALSARTAN 320 MG PO TABS: 320.0000 mg | ORAL_TABLET | Freq: Every day | ORAL | 3 refills | Status: DC
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
## 2022-09-16 NOTE — Patient Instructions (Signed)
Follow up appointment: February 16 at 2 pm  Go to the lab in 10 days to check kidney function (any day that week)  Take your BP meds as follows:  Stop losartan  Start valsartan 320 mg once daily  Continue with hydrochlorothiazide and metoprolol.  Check your blood pressure at home daily (if able) and keep record of the readings.  Hypertension "High blood pressure"  Hypertension is often called "The Silent Killer." It rarely causes symptoms until it is extremely  high or has done damage to other organs in the body. For this reason, you should have your  blood pressure checked regularly by your physician. We will check your blood pressure  every time you see a provider at one of our offices.   Your blood pressure reading consists of two numbers. Ideally, blood pressure should be  below 120/80. The first ("top") number is called the systolic pressure. It measures the  pressure in your arteries as your heart beats. The second ("bottom") number is called the diastolic pressure. It measures the pressure in your arteries as the heart relaxes between beats.  The benefits of getting your blood pressure under control are enormous. A 10-point  reduction in systolic blood pressure can reduce your risk of stroke by 27% and heart failure by 28%  Your blood pressure goal is < 130/80  To check your pressure at home you will need to:  1. Sit up in a chair, with feet flat on the floor and back supported. Do not cross your ankles or legs. 2. Rest your left arm so that the cuff is about heart level. If the cuff goes on your upper arm,  then just relax the arm on the table, arm of the chair or your lap. If you have a wrist cuff, we  suggest relaxing your wrist against your chest (think of it as Pledging the Flag with the  wrong arm).  3. Place the cuff snugly around your arm, about 1 inch above the crook of your elbow. The  cords should be inside the groove of your elbow.  4. Sit quietly, with the  cuff in place, for about 5 minutes. After that 5 minutes press the power  button to start a reading. 5. Do not talk or move while the reading is taking place.  6. Record your readings on a sheet of paper. Although most cuffs have a memory, it is often  easier to see a pattern developing when the numbers are all in front of you.  7. You can repeat the reading after 1-3 minutes if it is recommended  Make sure your bladder is empty and you have not had caffeine or tobacco within the last 30 min  Always bring your blood pressure log with you to your appointments. If you have not brought your monitor in to be double checked for accuracy, please bring it to your next appointment.  You can find a list of quality blood pressure cuffs at validatebp.org
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
## 2022-09-22 ENCOUNTER — Ambulatory Visit: Payer: No Typology Code available for payment source | Admitting: Occupational Therapy

## 2022-09-23 ENCOUNTER — Encounter: Payer: Self-pay | Admitting: Internal Medicine

## 2022-09-23 ENCOUNTER — Ambulatory Visit: Payer: No Typology Code available for payment source | Attending: Internal Medicine | Admitting: Internal Medicine

## 2022-09-23 VITALS — BP 140/90 | HR 110 | Ht 66.0 in | Wt 172.0 lb

## 2022-09-23 DIAGNOSIS — R002 Palpitations: Secondary | ICD-10-CM | POA: Diagnosis not present

## 2022-09-23 DIAGNOSIS — I10 Essential (primary) hypertension: Secondary | ICD-10-CM | POA: Diagnosis not present

## 2022-09-23 NOTE — Patient Instructions (Signed)
Medication Instructions:  No changes today *If you need a refill on your cardiac medications before your next appointment, please call your pharmacy*   Lab Work: none If you have labs (blood work) drawn today and your tests are completely normal, you will receive your results only by: [facility] (if you have MyChart) OR A paper copy in the mail If you have any lab test that is abnormal or we need to change your treatment, we will call you to review the results.   Testing/Procedures: none   Follow-Up: At [facility], you and your health needs are our priority.  As part of our continuing mission to provide you with exceptional heart care, we have created designated Provider Care Teams.  These Care Teams include your primary Cardiologist (physician) and Advanced Practice Providers (APPs -  Physician Assistants and Nurse Practitioners) who all work together to provide you with the care you need, when you need it.  We recommend signing up for the patient portal called "MyChart".  Sign up information is provided on this After Visit Summary.  MyChart is used to connect with patients for Virtual Visits (Telemedicine).  Patients are able to view lab/test results, encounter notes, upcoming appointments, etc.  Non-urgent messages can be sent to your provider as well.   To learn more about what you can do with MyChart, go to [email or web address].    Your next appointment:   6 month(s)  Provider:   Advanced Practice Provider (PA-C or NP)
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
## 2022-09-27 ENCOUNTER — Encounter: Payer: Self-pay | Admitting: Occupational Therapy

## 2022-09-27 ENCOUNTER — Ambulatory Visit: Payer: No Typology Code available for payment source | Admitting: Occupational Therapy

## 2022-09-27 DIAGNOSIS — M6281 Muscle weakness (generalized): Secondary | ICD-10-CM

## 2022-09-27 DIAGNOSIS — R278 Other lack of coordination: Secondary | ICD-10-CM

## 2022-09-27 DIAGNOSIS — M25632 Stiffness of left wrist, not elsewhere classified: Secondary | ICD-10-CM | POA: Diagnosis not present

## 2022-09-27 DIAGNOSIS — M25642 Stiffness of left hand, not elsewhere classified: Secondary | ICD-10-CM

## 2022-09-27 DIAGNOSIS — R208 Other disturbances of skin sensation: Secondary | ICD-10-CM

## 2022-09-27 DIAGNOSIS — M79642 Pain in left hand: Secondary | ICD-10-CM

## 2022-09-27 DIAGNOSIS — R600 Localized edema: Secondary | ICD-10-CM

## 2022-09-27 NOTE — Patient Instructions (Addendum)
   PROM: Wrist Flexion / Extension   Grasp  hand and slowly bend wrist until stretch is felt. Relax. Then stretch as far as possible in opposite direction. Be sure to keep elbow bent.  Hold __10__ sec. each way Repeat _5___ times per set.    Do _4-6___ sessions per day.   MP / PIP / DIP Composite Flexion (Passive Stretch)    Use other hand to bend  finger at all three joints. Hold 10 seconds. Repeat 5 times. Do 4-6 sessions per day.     PIP / DIP Composite Flexion (Passive Stretch)    Use other hand to bend middle and tip joints of finger. Hold 10 seconds. Repeat 5 times. Do 4-6 sessions per day.   MP Flexion (Assistive)    With hand resting on table, slide fingers to bend at large knuckles. Use other hand to help if needed. Hold 10 seconds. Repeat 5 times. Do 4-6 sessions per day.   PIP Flexion / Extension    Using other hand's index finger and thumb, brace finger to prevent bending of bottom knuckle. Actively bend middle knuckle. Relax. Straighten finger as far as possible. Hold each position 3 seconds. Repeat 10 times per session. Do 4-6 sessions per day.   Edema Reduction (Retrograde Massage)    A. Enclose tip of finger with other hand and slide toward wrist. B. For larger areas, massage toward the body in one direction only. Repeat 10 times. Do 2 sessions per day.    Edema Reduction (Pumping Exercises)    Hold hand overhead. Squeeze fingers together, making a fist. Repeat 10 times. Spread fingers apart then press together. Repeat 10 times. Do 4-6 sessions per day.   Edema Reduction (Ice Bag)    Heat hand for up to 3min (warm water or hot pack).  Exercise, then Wrap a bag of frozen peas or crushed ice in thin towel and place on hand for 5-8 minutes. Do 2 times per day.    Elevate hand on pillows, table, etc when at rest.
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
## 2022-09-29 ENCOUNTER — Ambulatory Visit: Payer: No Typology Code available for payment source | Attending: Sports Medicine | Admitting: Occupational Therapy

## 2022-09-29 ENCOUNTER — Encounter: Payer: Self-pay | Admitting: Occupational Therapy

## 2022-09-29 ENCOUNTER — Encounter: Payer: Self-pay | Admitting: Pharmacist Clinician (PhC)/ Clinical Pharmacy Specialist

## 2022-09-29 DIAGNOSIS — M79642 Pain in left hand: Secondary | ICD-10-CM | POA: Diagnosis not present

## 2022-09-29 DIAGNOSIS — M25632 Stiffness of left wrist, not elsewhere classified: Secondary | ICD-10-CM | POA: Diagnosis not present

## 2022-09-29 DIAGNOSIS — R278 Other lack of coordination: Secondary | ICD-10-CM | POA: Insufficient documentation

## 2022-09-29 DIAGNOSIS — R208 Other disturbances of skin sensation: Secondary | ICD-10-CM | POA: Insufficient documentation

## 2022-09-29 DIAGNOSIS — M6281 Muscle weakness (generalized): Secondary | ICD-10-CM | POA: Diagnosis not present

## 2022-09-29 DIAGNOSIS — R600 Localized edema: Secondary | ICD-10-CM | POA: Insufficient documentation

## 2022-09-29 DIAGNOSIS — M25642 Stiffness of left hand, not elsewhere classified: Secondary | ICD-10-CM | POA: Insufficient documentation

## 2022-09-29 DIAGNOSIS — I10 Essential (primary) hypertension: Secondary | ICD-10-CM | POA: Insufficient documentation

## 2022-09-29 NOTE — Assessment & Plan Note (Signed)
Assessment: BP is un/controlled in office BP 156/90 mmHg;  above the goal (<130/80). Tolerates current medications well without any side effects Denies SOB, palpitation, chest pain, headaches,or swelling No concerns with compliance Reiterated the importance of regular exercise and low salt diet   Plan:  Stop taking losartan 100 mg Start taking valsartan 320 mg once daily Continue taking hydrochlorothiazide and metoprolol Patient to keep record of BP readings with heart rate and report to [place] at the next visit Patient to follow up with PharmD in 4 weeks for follow up  Labs ordered today:  BMET - 2 weeks
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
## 2022-10-03 ENCOUNTER — Ambulatory Visit: Payer: No Typology Code available for payment source | Admitting: Occupational Therapy

## 2022-10-03 ENCOUNTER — Encounter: Payer: Self-pay | Admitting: Occupational Therapy

## 2022-10-03 DIAGNOSIS — M79642 Pain in left hand: Secondary | ICD-10-CM

## 2022-10-03 DIAGNOSIS — M25632 Stiffness of left wrist, not elsewhere classified: Secondary | ICD-10-CM | POA: Diagnosis not present

## 2022-10-03 DIAGNOSIS — R208 Other disturbances of skin sensation: Secondary | ICD-10-CM

## 2022-10-03 DIAGNOSIS — R600 Localized edema: Secondary | ICD-10-CM

## 2022-10-03 DIAGNOSIS — M6281 Muscle weakness (generalized): Secondary | ICD-10-CM

## 2022-10-03 DIAGNOSIS — M25642 Stiffness of left hand, not elsewhere classified: Secondary | ICD-10-CM

## 2022-10-03 DIAGNOSIS — R278 Other lack of coordination: Secondary | ICD-10-CM

## 2022-10-03 NOTE — Patient Instructions (Addendum)
    Extension (Assistive Putty)   Roll putty back and forth, being sure to use all fingertips. Repeat 2 times. Do 1-2 sessions per day.  Then pinch as below.   Palmar Pinch Strengthening (Resistive Putty)   Pinch putty between thumb and each fingertip in turn after rolling out      Grip Strengthening (Resistive Putty)   Squeeze putty using thumb and all fingers. Repeat 15 times. Do 1-2 sessions per day.

## 2022-10-05 ENCOUNTER — Ambulatory Visit: Payer: No Typology Code available for payment source | Admitting: Occupational Therapy

## 2022-10-05 ENCOUNTER — Encounter: Payer: Self-pay | Admitting: Occupational Therapy

## 2022-10-05 DIAGNOSIS — M25632 Stiffness of left wrist, not elsewhere classified: Secondary | ICD-10-CM

## 2022-10-05 DIAGNOSIS — R278 Other lack of coordination: Secondary | ICD-10-CM

## 2022-10-05 DIAGNOSIS — M25642 Stiffness of left hand, not elsewhere classified: Secondary | ICD-10-CM

## 2022-10-05 DIAGNOSIS — M79642 Pain in left hand: Secondary | ICD-10-CM

## 2022-10-05 DIAGNOSIS — R600 Localized edema: Secondary | ICD-10-CM

## 2022-10-05 DIAGNOSIS — M6281 Muscle weakness (generalized): Secondary | ICD-10-CM
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
## 2022-10-05 NOTE — Patient Instructions (Signed)
Composite Extension (Passive Flexor Stretch)    Sitting with elbows on table and palms together, slowly lower wrists toward table until stretch is felt. Be sure to keep palms together throughout stretch. Hold __10__ seconds. Relax. Repeat __5__ times. Do __4__ sessions per day.  PEN ROLLING EXERCISE - perform 5 repetitions, 2x/day (Can replace the original ex's for 2 sessions per day)

## 2022-10-11 ENCOUNTER — Ambulatory Visit: Payer: No Typology Code available for payment source | Admitting: Occupational Therapy

## 2022-10-11 ENCOUNTER — Encounter: Payer: Self-pay | Admitting: Occupational Therapy

## 2022-10-11 DIAGNOSIS — M79642 Pain in left hand: Secondary | ICD-10-CM

## 2022-10-11 DIAGNOSIS — M25632 Stiffness of left wrist, not elsewhere classified: Secondary | ICD-10-CM

## 2022-10-11 DIAGNOSIS — M25642 Stiffness of left hand, not elsewhere classified: Secondary | ICD-10-CM
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
## 2022-10-13 ENCOUNTER — Ambulatory Visit: Payer: No Typology Code available for payment source | Admitting: Occupational Therapy

## 2022-10-13 ENCOUNTER — Encounter: Payer: Self-pay | Admitting: Occupational Therapy

## 2022-10-13 DIAGNOSIS — R600 Localized edema: Secondary | ICD-10-CM

## 2022-10-13 DIAGNOSIS — M25632 Stiffness of left wrist, not elsewhere classified: Secondary | ICD-10-CM

## 2022-10-13 DIAGNOSIS — R208 Other disturbances of skin sensation: Secondary | ICD-10-CM

## 2022-10-13 DIAGNOSIS — M6281 Muscle weakness (generalized): Secondary | ICD-10-CM

## 2022-10-13 DIAGNOSIS — R278 Other lack of coordination: Secondary | ICD-10-CM

## 2022-10-13 DIAGNOSIS — M25642 Stiffness of left hand, not elsewhere classified: Secondary | ICD-10-CM

## 2022-10-13 DIAGNOSIS — M79642 Pain in left hand: Secondary | ICD-10-CM

## 2022-10-13 NOTE — Patient Instructions (Signed)
Wrist Flexion: Resisted    With right palm up, _1___ pound weight in hand or small water bottle / can, bend wrist up. Return slowly. Repeat __10__ times per set. Do __1__ sets per session. Do _1-2___ sessions per day.  Repeat with palm down, 10 reps 1-2 x day. Stop if increased pain  Copyright  VHI. All rights reserved.
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
## 2022-10-14 ENCOUNTER — Ambulatory Visit: Payer: No Typology Code available for payment source

## 2022-10-17 ENCOUNTER — Ambulatory Visit: Payer: No Typology Code available for payment source | Admitting: Occupational Therapy

## 2022-10-17 ENCOUNTER — Encounter: Payer: Self-pay | Admitting: Occupational Therapy

## 2022-10-17 DIAGNOSIS — M79642 Pain in left hand: Secondary | ICD-10-CM

## 2022-10-17 DIAGNOSIS — M25632 Stiffness of left wrist, not elsewhere classified: Secondary | ICD-10-CM | POA: Diagnosis not present

## 2022-10-17 DIAGNOSIS — R208 Other disturbances of skin sensation: Secondary | ICD-10-CM

## 2022-10-17 DIAGNOSIS — M6281 Muscle weakness (generalized): Secondary | ICD-10-CM

## 2022-10-17 DIAGNOSIS — I10 Essential (primary) hypertension: Secondary | ICD-10-CM | POA: Diagnosis not present

## 2022-10-17 DIAGNOSIS — R278 Other lack of coordination: Secondary | ICD-10-CM

## 2022-10-17 DIAGNOSIS — R600 Localized edema: Secondary | ICD-10-CM

## 2022-10-17 DIAGNOSIS — M25642 Stiffness of left hand, not elsewhere classified: Secondary | ICD-10-CM
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
## 2022-10-18 ENCOUNTER — Encounter: Payer: Self-pay | Admitting: *Deleted

## 2022-10-18 LAB — BASIC METABOLIC PANEL
BUN/Creatinine Ratio: 17 (ref 12–28)
BUN: 14 mg/dL (ref 8–27)
CO2: 23 mmol/L (ref 20–29)
Calcium: 9.9 mg/dL (ref 8.7–10.3)
Chloride: 105 mmol/L (ref 96–106)
Creatinine, Ser: 0.83 mg/dL (ref 0.57–1.00)
Glucose: 75 mg/dL (ref 70–99)
Potassium: 4 mmol/L (ref 3.5–5.2)
Sodium: 141 mmol/L (ref 134–144)
eGFR: 77 mL/min/{1.73_m2} (ref >59)

## 2022-10-19 ENCOUNTER — Ambulatory Visit: Payer: No Typology Code available for payment source | Admitting: Occupational Therapy

## 2022-10-19 ENCOUNTER — Encounter: Payer: Self-pay | Admitting: Occupational Therapy

## 2022-10-19 DIAGNOSIS — R208 Other disturbances of skin sensation: Secondary | ICD-10-CM

## 2022-10-19 DIAGNOSIS — R278 Other lack of coordination: Secondary | ICD-10-CM

## 2022-10-19 DIAGNOSIS — M25632 Stiffness of left wrist, not elsewhere classified: Secondary | ICD-10-CM | POA: Diagnosis not present

## 2022-10-19 DIAGNOSIS — M6281 Muscle weakness (generalized): Secondary | ICD-10-CM

## 2022-10-19 DIAGNOSIS — M79642 Pain in left hand: Secondary | ICD-10-CM

## 2022-10-19 DIAGNOSIS — M25642 Stiffness of left hand, not elsewhere classified: Secondary | ICD-10-CM
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
## 2022-10-24 ENCOUNTER — Ambulatory Visit: Payer: No Typology Code available for payment source | Admitting: Occupational Therapy

## 2022-10-24 ENCOUNTER — Encounter: Payer: Self-pay | Admitting: Occupational Therapy

## 2022-10-24 DIAGNOSIS — M25632 Stiffness of left wrist, not elsewhere classified: Secondary | ICD-10-CM | POA: Diagnosis not present

## 2022-10-24 DIAGNOSIS — R278 Other lack of coordination: Secondary | ICD-10-CM

## 2022-10-24 DIAGNOSIS — M6281 Muscle weakness (generalized): Secondary | ICD-10-CM

## 2022-10-24 DIAGNOSIS — M25642 Stiffness of left hand, not elsewhere classified: Secondary | ICD-10-CM

## 2022-10-24 DIAGNOSIS — M79642 Pain in left hand: Secondary | ICD-10-CM
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
## 2022-10-24 NOTE — Patient Instructions (Signed)
Wrist Radial Deviation: Resisted    With right thumb up, ____ pound weight in hand, bend wrist up. Return slowly. Repeat ____ times per set. Do ____ sets per session. Do ____ sessions per day.  Copyright  VHI. All rights reserved.

## 2022-10-26 ENCOUNTER — Ambulatory Visit
Payer: No Typology Code available for payment source | Attending: Cardiovascular Disease | Admitting: Pharmacist Clinician (PhC)/ Clinical Pharmacy Specialist

## 2022-10-26 ENCOUNTER — Encounter: Payer: Self-pay | Admitting: Pharmacist Clinician (PhC)/ Clinical Pharmacy Specialist

## 2022-10-26 VITALS — BP 123/76 | HR 93

## 2022-10-26 DIAGNOSIS — I10 Essential (primary) hypertension: Secondary | ICD-10-CM
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
## 2022-10-26 NOTE — Patient Instructions (Signed)
Follow up appointment: [place] OUT TO YOU  Take your BP meds as follows: CONTINUE WITH YOUR CURRENT MEDICATIONS  Check your blood pressure at home daily (if able) and keep record of the readings.  Hypertension "High blood pressure"  Hypertension is often called "The Silent Killer." It rarely causes symptoms until it is extremely  high or has done damage to other organs in the body. For this reason, you should have your  blood pressure checked regularly by your physician. We will check your blood pressure  every time you see a provider at one of our offices.   Your blood pressure reading consists of two numbers. Ideally, blood pressure should be  below 120/80. The first ("top") number is called the systolic pressure. It measures the  pressure in your arteries as your heart beats. The second ("bottom") number is called the diastolic pressure. It measures the pressure in your arteries as the heart relaxes between beats.  The benefits of getting your blood pressure under control are enormous. A 10-point  reduction in systolic blood pressure can reduce your risk of stroke by 27% and heart failure by 28%  Your blood pressure goal is <130/80  To check your pressure at home you will need to:  1. Sit up in a chair, with feet flat on the floor and back supported. Do not cross your ankles or legs. 2. Rest your left arm so that the cuff is about heart level. If the cuff goes on your upper arm,  then just relax the arm on the table, arm of the chair or your lap. If you have a wrist cuff, we  suggest relaxing your wrist against your chest (think of it as Pledging the Flag with the  wrong arm).  3. Place the cuff snugly around your arm, about 1 inch above the crook of your elbow. The  cords should be inside the groove of your elbow.  4. Sit quietly, with the cuff in place, for about 5 minutes. After that 5 minutes press the power  button to start a  reading. 5. Do not talk or move while the reading is taking place.  6. Record your readings on a sheet of paper. Although most cuffs have a memory, it is often  easier to see a pattern developing when the numbers are all in front of you.  7. You can repeat the reading after 1-3 minutes if it is recommended  Make sure your bladder is empty and you have not had caffeine or tobacco within the last 30 min  Always bring your blood pressure log with you to your appointments. If you have not brought your monitor in to be double checked for accuracy, please bring it to your next appointment.  You can find a list of quality blood pressure cuffs at validatebp.org

## 2022-10-27 ENCOUNTER — Encounter: Payer: Self-pay | Admitting: Pharmacist Clinician (PhC)/ Clinical Pharmacy Specialist

## 2022-10-27 NOTE — Assessment & Plan Note (Signed)
Assessment: BP is controlled in office BP 123/76 mmHg;  above the goal (<130/80). Pain level from wrist break down considerably, will finish PT next week Tolerates current medictions well without any side effects Denies SOB, palpitation, chest pain, headaches,or swelling Reiterated the importance of regular exercise and low salt diet   Plan:  Continue taking valsartan, metoprolol and hctz Patient to keep record of BP readings with heart rate and report to [place] at the next visit Patient to follow up with PA in July for follow up  Labs ordered today:  none Patient aware to reach out to office with any concerning BP readings in the next few months

## 2022-10-31 ENCOUNTER — Ambulatory Visit: Payer: No Typology Code available for payment source | Attending: Sports Medicine | Admitting: Occupational Therapy

## 2022-10-31 DIAGNOSIS — R600 Localized edema: Secondary | ICD-10-CM | POA: Diagnosis not present

## 2022-10-31 DIAGNOSIS — M6281 Muscle weakness (generalized): Secondary | ICD-10-CM | POA: Diagnosis not present

## 2022-10-31 DIAGNOSIS — M25632 Stiffness of left wrist, not elsewhere classified: Secondary | ICD-10-CM | POA: Diagnosis not present

## 2022-10-31 DIAGNOSIS — R208 Other disturbances of skin sensation: Secondary | ICD-10-CM | POA: Diagnosis not present

## 2022-10-31 DIAGNOSIS — M79642 Pain in left hand: Secondary | ICD-10-CM | POA: Diagnosis not present

## 2022-10-31 DIAGNOSIS — M25642 Stiffness of left hand, not elsewhere classified: Secondary | ICD-10-CM | POA: Diagnosis not present
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
## 2022-11-08 DIAGNOSIS — H2513 Age-related nuclear cataract, bilateral: Secondary | ICD-10-CM | POA: Diagnosis not present

## 2022-11-10 DIAGNOSIS — Z1231 Encounter for screening mammogram for malignant neoplasm of breast: Secondary | ICD-10-CM | POA: Diagnosis not present

## 2022-12-05 DIAGNOSIS — M79642 Pain in left hand: Secondary | ICD-10-CM | POA: Diagnosis not present

## 2022-12-08 DIAGNOSIS — M65342 Trigger finger, left ring finger: Secondary | ICD-10-CM | POA: Diagnosis not present

## 2023-02-06 DIAGNOSIS — R21 Rash and other nonspecific skin eruption: Secondary | ICD-10-CM | POA: Diagnosis not present

## 2023-04-06 DIAGNOSIS — I10 Essential (primary) hypertension: Secondary | ICD-10-CM | POA: Diagnosis not present

## 2023-04-06 DIAGNOSIS — S61019A Laceration without foreign body of unspecified thumb without damage to nail, initial encounter: Secondary | ICD-10-CM | POA: Diagnosis not present

## 2023-04-06 DIAGNOSIS — Z23 Encounter for immunization: Secondary | ICD-10-CM | POA: Diagnosis not present

## 2023-06-01 IMAGING — DX DG KNEE 3 VIEWS*R*
3 series · 3 of 3 positions shown · non-contrast
Comparison: None

CLINICAL DATA: Knee pain

EXAM:
RIGHT KNEE - 3 VIEW

[dg knee 3 views right (1 of 3)]
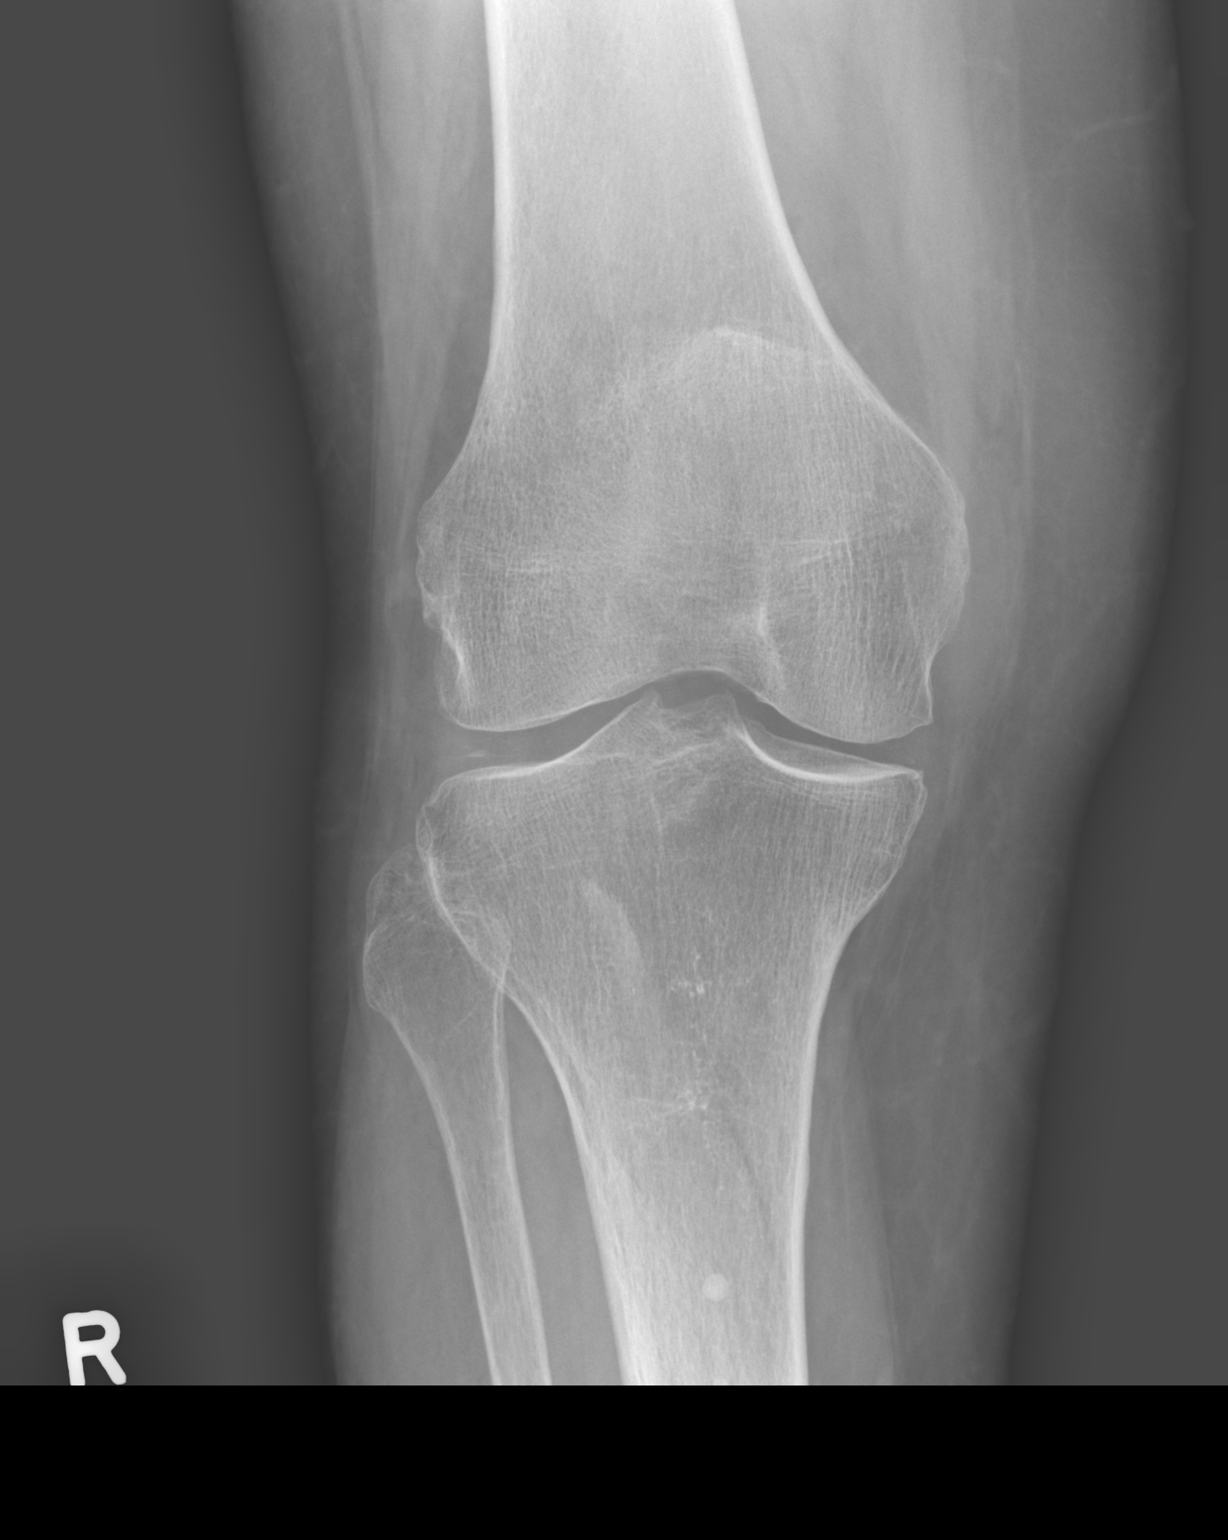

[dg knee 3 views right (2 of 3)]
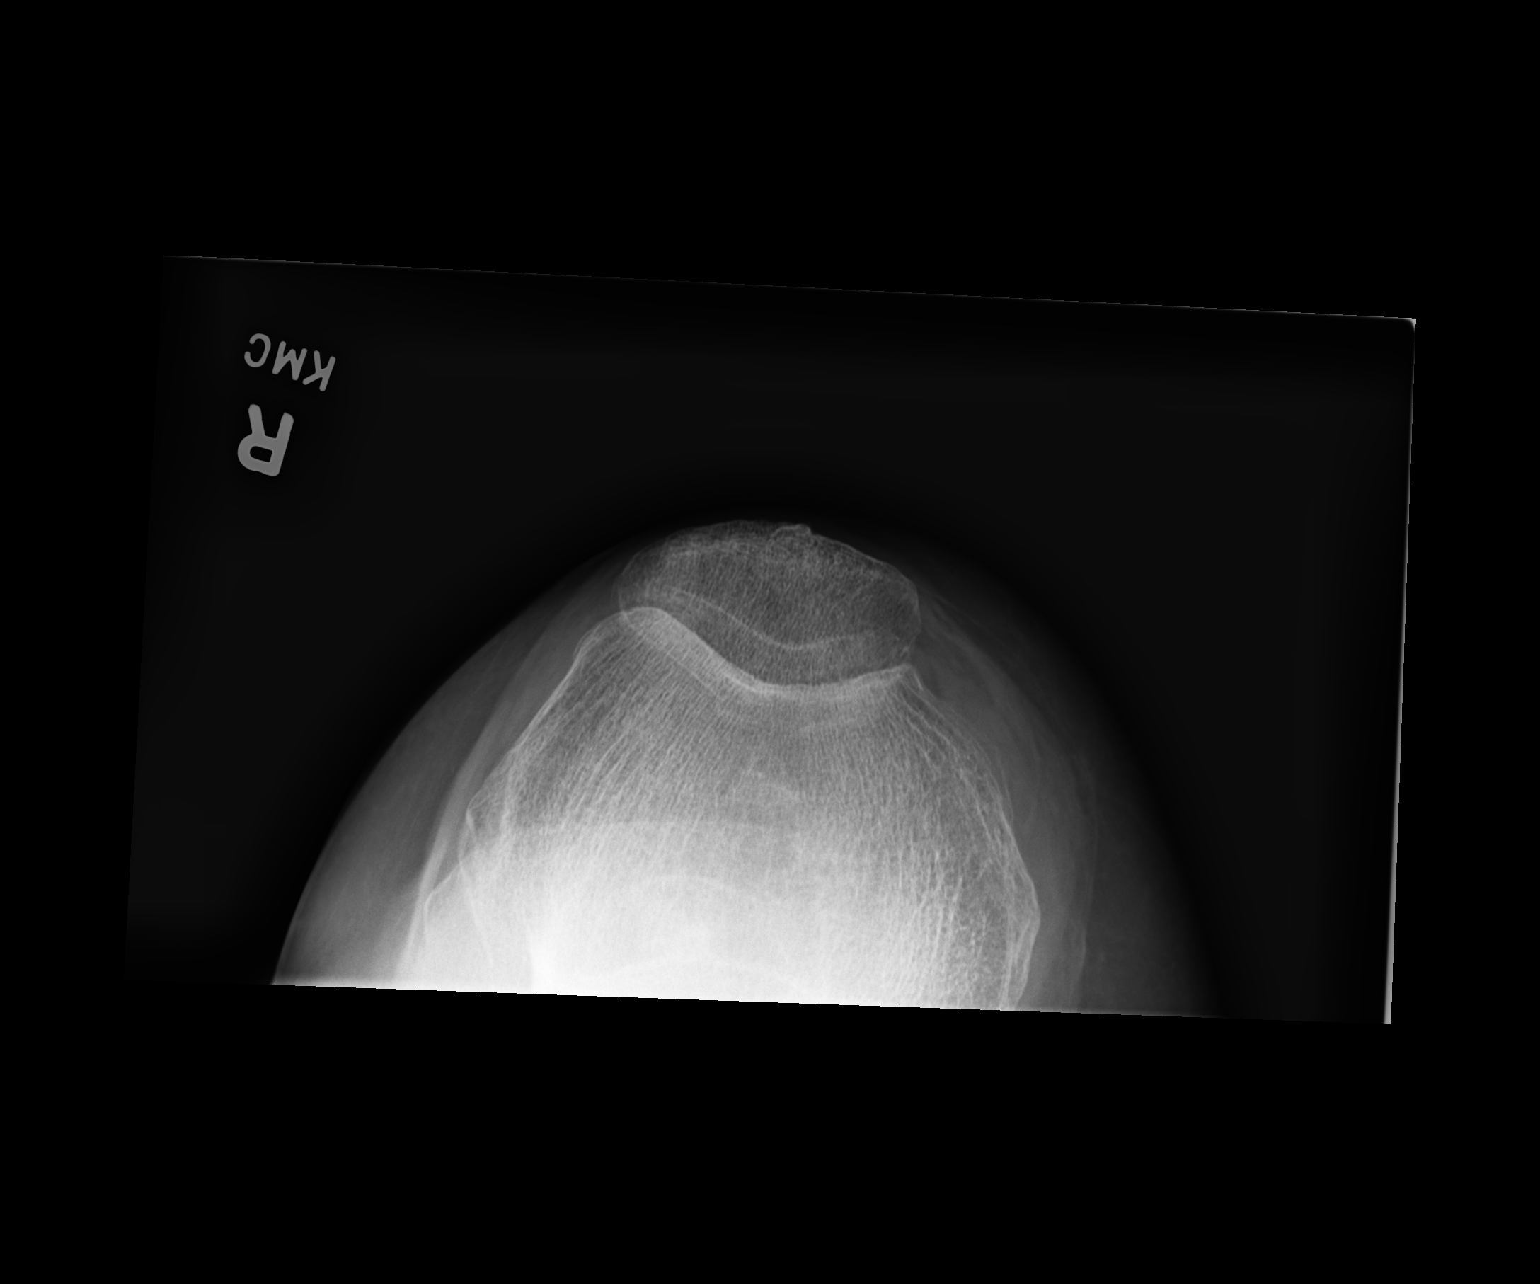

[dg knee 3 views right (3 of 3)]
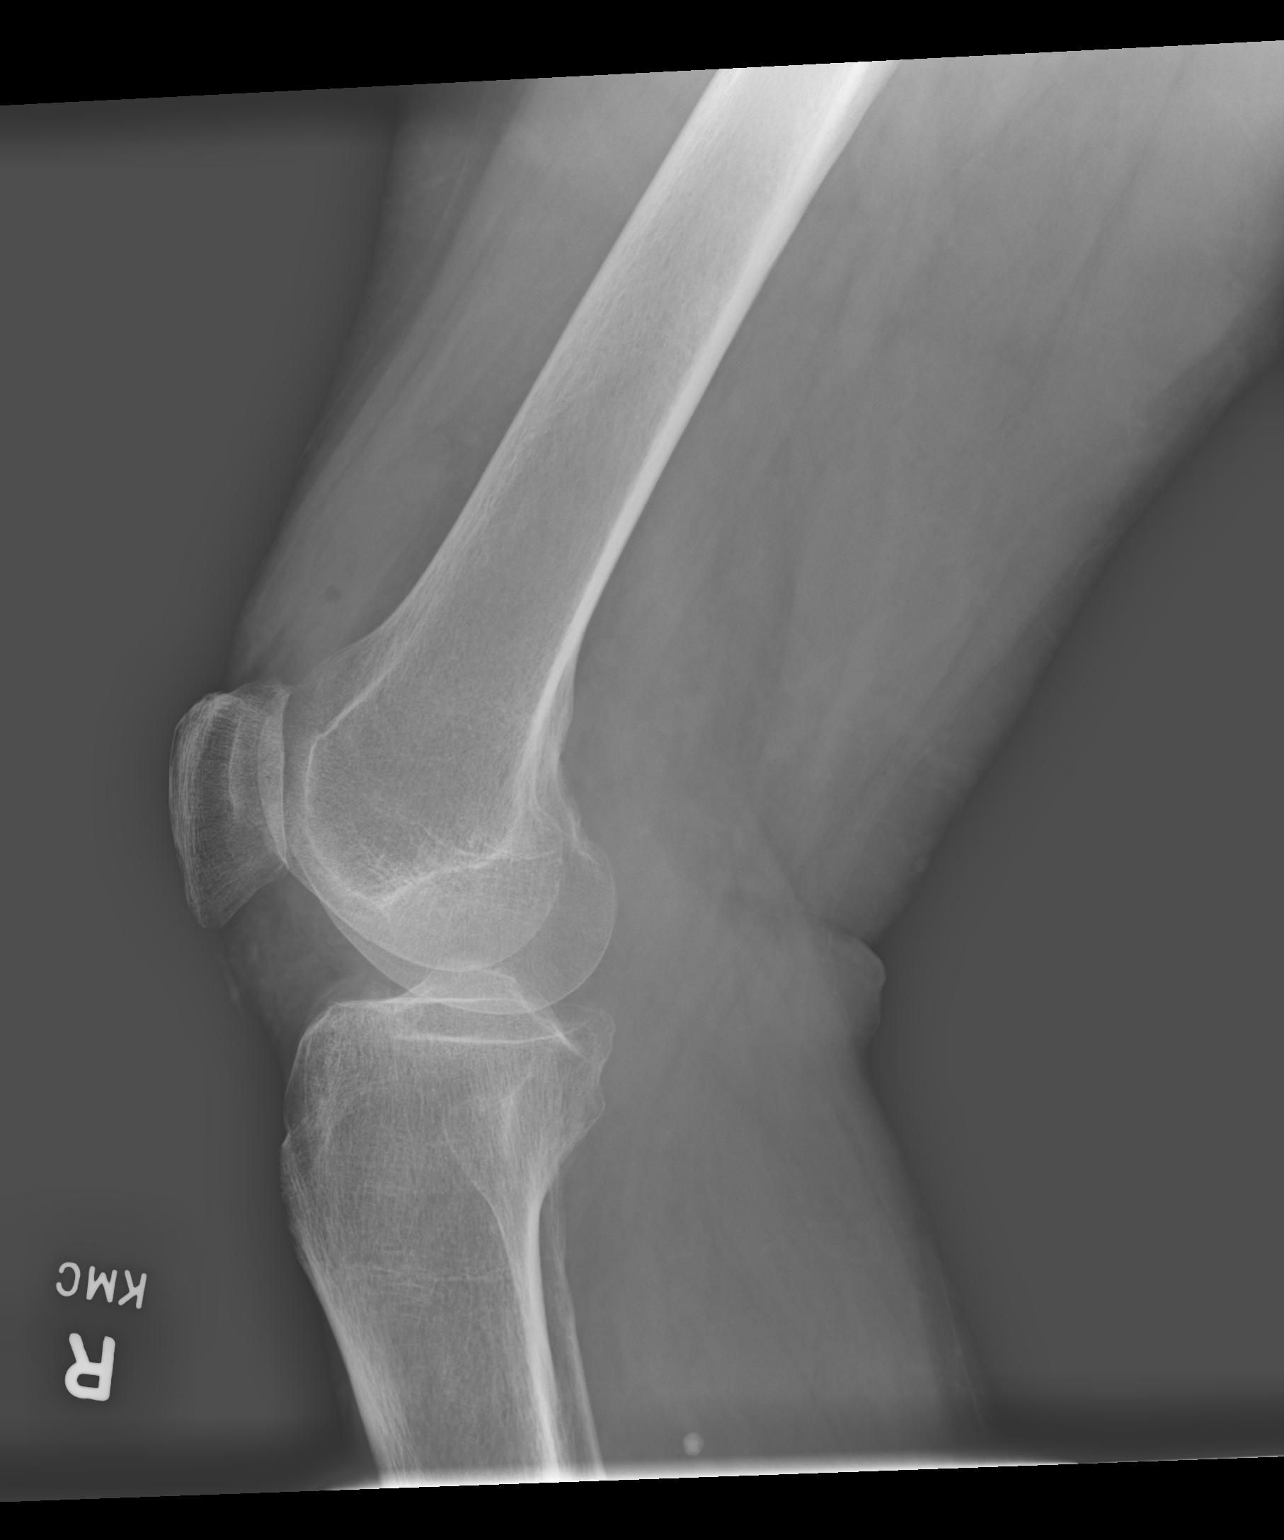

[3 of 3 positions shown; findings below may reference images not displayed]

FINDINGS: No acute fracture or dislocation. Joint spaces are well-maintained.
Moderate joint effusion. Soft tissues are unremarkable.
IMPRESSION: No acute osseous abnormality or significant arthropathy.

Moderate joint effusion.

## 2023-09-26 ENCOUNTER — Other Ambulatory Visit: Payer: Self-pay | Admitting: Internal Medicine

## 2023-10-03 ENCOUNTER — Ambulatory Visit
Admission: RE | Admit: 2023-10-03 | Discharge: 2023-10-03 | Disposition: A | Discharge status: Home / Self Care | Payer: No Typology Code available for payment source | Source: Ambulatory Visit | Attending: Physician Assistant | Admitting: Physician Assistant

## 2023-10-03 ENCOUNTER — Other Ambulatory Visit: Payer: Self-pay | Admitting: Physician Assistant

## 2023-10-03 DIAGNOSIS — W19XXXA Unspecified fall, initial encounter: Secondary | ICD-10-CM

## 2023-10-30 ENCOUNTER — Other Ambulatory Visit: Payer: Self-pay | Admitting: Internal Medicine

## 2023-10-31 ENCOUNTER — Other Ambulatory Visit: Payer: Self-pay | Admitting: Internal Medicine

## 2023-11-14 DIAGNOSIS — H6692 Otitis media, unspecified, left ear: Secondary | ICD-10-CM | POA: Diagnosis not present

## 2023-11-14 DIAGNOSIS — H9209 Otalgia, unspecified ear: Secondary | ICD-10-CM | POA: Diagnosis not present

## 2023-11-14 DIAGNOSIS — R220 Localized swelling, mass and lump, head: Secondary | ICD-10-CM | POA: Diagnosis not present

## 2023-11-14 DIAGNOSIS — R110 Nausea: Secondary | ICD-10-CM | POA: Diagnosis not present

## 2023-11-28 DIAGNOSIS — M67431 Ganglion, right wrist: Secondary | ICD-10-CM | POA: Diagnosis not present

## 2023-11-28 DIAGNOSIS — L820 Inflamed seborrheic keratosis: Secondary | ICD-10-CM | POA: Diagnosis not present

## 2023-11-28 DIAGNOSIS — L821 Other seborrheic keratosis: Secondary | ICD-10-CM | POA: Diagnosis not present

## 2023-11-28 DIAGNOSIS — L7211 Pilar cyst: Secondary | ICD-10-CM | POA: Diagnosis not present

## 2023-11-28 DIAGNOSIS — L538 Other specified erythematous conditions: Secondary | ICD-10-CM | POA: Diagnosis not present

## 2023-12-01 ENCOUNTER — Other Ambulatory Visit: Payer: Self-pay | Admitting: Family Medicine

## 2023-12-01 DIAGNOSIS — J3489 Other specified disorders of nose and nasal sinuses: Secondary | ICD-10-CM

## 2023-12-12 DIAGNOSIS — E7800 Pure hypercholesterolemia, unspecified: Secondary | ICD-10-CM | POA: Diagnosis not present

## 2023-12-12 DIAGNOSIS — Z1231 Encounter for screening mammogram for malignant neoplasm of breast: Secondary | ICD-10-CM | POA: Diagnosis not present

## 2023-12-13 ENCOUNTER — Ambulatory Visit
Admission: RE | Admit: 2023-12-13 | Discharge: 2023-12-13 | Disposition: A | Discharge status: Home / Self Care | Source: Ambulatory Visit | Attending: Family Medicine | Admitting: Family Medicine

## 2023-12-13 DIAGNOSIS — J3489 Other specified disorders of nose and nasal sinuses: Secondary | ICD-10-CM

## 2023-12-13 DIAGNOSIS — J342 Deviated nasal septum: Secondary | ICD-10-CM | POA: Diagnosis not present

## 2024-01-26 DIAGNOSIS — J309 Allergic rhinitis, unspecified: Secondary | ICD-10-CM | POA: Diagnosis not present

## 2024-01-26 DIAGNOSIS — J342 Deviated nasal septum: Secondary | ICD-10-CM | POA: Diagnosis not present

## 2024-01-26 DIAGNOSIS — R930 Abnormal findings on diagnostic imaging of skull and head, not elsewhere classified: Secondary | ICD-10-CM | POA: Diagnosis not present

## 2024-02-28 DIAGNOSIS — Z03818 Encounter for observation for suspected exposure to other biological agents ruled out: Secondary | ICD-10-CM | POA: Diagnosis not present

## 2024-02-28 DIAGNOSIS — R051 Acute cough: Secondary | ICD-10-CM | POA: Diagnosis not present

## 2024-02-28 DIAGNOSIS — J988 Other specified respiratory disorders: Secondary | ICD-10-CM | POA: Diagnosis not present

## 2024-05-28 ENCOUNTER — Other Ambulatory Visit (HOSPITAL_COMMUNITY): Payer: Self-pay | Admitting: Family Medicine

## 2024-05-28 ENCOUNTER — Ambulatory Visit (HOSPITAL_COMMUNITY)
Admission: RE | Admit: 2024-05-28 | Discharge: 2024-05-28 | Disposition: A | Discharge status: Home / Self Care | Source: Ambulatory Visit | Attending: Vascular Surgery | Admitting: Vascular Surgery

## 2024-05-28 DIAGNOSIS — M7989 Other specified soft tissue disorders: Secondary | ICD-10-CM | POA: Diagnosis not present

## 2024-05-28 DIAGNOSIS — I868 Varicose veins of other specified sites: Secondary | ICD-10-CM | POA: Diagnosis not present

## 2024-05-28 DIAGNOSIS — M79605 Pain in left leg: Secondary | ICD-10-CM | POA: Insufficient documentation
